# Patient Record
Sex: Female | Born: 1995
Health system: Southern US, Community
[De-identification: ages and names within clinical notes are randomized; demographics above are authoritative.]

## PROBLEM LIST (undated history)

## (undated) DIAGNOSIS — F419 Anxiety disorder, unspecified: Secondary | ICD-10-CM

## (undated) DIAGNOSIS — M419 Scoliosis, unspecified: Secondary | ICD-10-CM

## (undated) HISTORY — PX: BACK SURGERY: SHX140

## (undated) HISTORY — DX: Scoliosis, unspecified: M41.9

## (undated) HISTORY — DX: Anxiety disorder, unspecified: F41.9

---
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
## 2017-11-04 ENCOUNTER — Emergency Department
Admission: EM | Admit: 2017-11-04 | Discharge: 2017-11-04 | Disposition: A | Discharge status: Home / Self Care | Payer: 59 | Attending: Emergency Medicine | Admitting: Emergency Medicine

## 2017-11-04 ENCOUNTER — Ambulatory Visit
Admission: RE | Admit: 2017-11-04 | Discharge: 2017-11-04 | Disposition: A | Discharge status: Home / Self Care | Payer: 59 | Source: Ambulatory Visit | Attending: Nurse Practitioner | Admitting: Nurse Practitioner

## 2017-11-04 ENCOUNTER — Other Ambulatory Visit: Payer: Self-pay | Admitting: Nurse Practitioner

## 2017-11-04 ENCOUNTER — Other Ambulatory Visit: Payer: Self-pay

## 2017-11-04 ENCOUNTER — Encounter: Payer: Self-pay | Admitting: Emergency Medicine

## 2017-11-04 DIAGNOSIS — M25561 Pain in right knee: Secondary | ICD-10-CM | POA: Insufficient documentation

## 2017-11-04 DIAGNOSIS — R21 Rash and other nonspecific skin eruption: Secondary | ICD-10-CM | POA: Diagnosis not present

## 2017-11-04 DIAGNOSIS — R52 Pain, unspecified: Secondary | ICD-10-CM

## 2017-11-04 DIAGNOSIS — F430 Acute stress reaction: Secondary | ICD-10-CM | POA: Diagnosis not present

## 2017-11-04 DIAGNOSIS — F411 Generalized anxiety disorder: Secondary | ICD-10-CM | POA: Diagnosis not present

## 2017-11-04 DIAGNOSIS — Z79899 Other long term (current) drug therapy: Secondary | ICD-10-CM | POA: Insufficient documentation

## 2017-11-04 DIAGNOSIS — F419 Anxiety disorder, unspecified: Secondary | ICD-10-CM | POA: Diagnosis present

## 2017-11-04 MED ORDER — HYDROXYZINE HCL 25 MG PO TABS
25.0000 mg | ORAL_TABLET | Freq: Once | ORAL | Status: AC
  Administered 2017-11-04: 25 mg via ORAL
  Filled 2017-11-04: qty 1

## 2017-11-04 MED ORDER — HYDROXYZINE HCL 25 MG PO TABS: 25.0000 mg | ORAL_TABLET | Freq: Three times a day (TID) | ORAL | 0 refills | Status: DC | PRN

## 2017-11-04 NOTE — ED Triage Notes (Signed)
Recently found out (last week) that she is allergic to gluten.  Today had some nachos and cheese. Felt heart palpitations and felt shaky. Called PCP who referred her to ED for evaluation of anxiety vs allergic reaction.  Symptoms began today around 1615.  Symptoms have improved.  Continues to c/o chest feeling tight and lips tingling.    Patient is AAOx3,  Skin warm and dry.  NAD.  Respirations are regular and non labored.  Voice clear and strong.

## 2017-11-04 NOTE — Discharge Instructions (Signed)
You may take medication as prescribed.  I highly recommend you follow-up with your medical provider to discuss anxiety related to new diet restrictions.  If you begin to experience return of symptoms or symptoms significantly worsen do not hesitate to return the emergency department.

## 2017-11-04 NOTE — ED Provider Notes (Signed)
[facility] Emergency Department Provider Note   ____________________________________________   I have reviewed the triage vital signs and the nursing notes.   HISTORY  Chief Complaint Allergic Reaction    HPI Grace Buchanan is a 21 y.o. female presents emergency department with anxiety symptoms to include racing heart, chest tightness, tingling lips and fingers that began after she realized she had eaten a cheese dip with gluten earlier this evening.  Patient recently found out that she is not supposed to be eating gluten-based products and has been avoiding these products.  Patient reported that when she eats gluten-based products she experiences chest tightness and has been worked up for food allergies.  Patient's primary care provider recently completed blood work to determine that she is allergic to seafood and gluten-based products.  Patient denies any anaphylactic reaction to seafood or gluten-based products.  Patient reports when she began to experience the above symptoms she called her provider and they informed her not to take any medication and to immediately seek care in the emergency department. Patient denies fever, chills, headache, vision changes, chest pain, shortness of breath, abdominal pain, nausea and vomiting.  History reviewed. No pertinent past medical history.  There are no active problems to display for this patient.   Past Surgical History:  Procedure Laterality Date  . BACK SURGERY      Prior to Admission medications   Medication Sig Start Date End Date Taking? Authorizing Provider  amoxicillin (AMOXIL) 500 MG capsule Take 1 capsule (500 mg total) by mouth 3 (three) times daily. 12/24/16   Loleta RoseForbach, Cory, MD  hydrOXYzine (ATARAX/VISTARIL) 25 MG tablet Take 1 tablet (25 mg total) 3 (three) times daily as needed by mouth for anxiety or itching. 11/04/17   Little, Traci M, PA-C  ibuprofen (ADVIL,MOTRIN) 600 MG tablet Take 1 tablet by  mouth three times daily with meals 12/24/16   Loleta RoseForbach, Cory, MD    Allergies Dust mite extract; Fish allergy; Gluten meal; and Shrimp [shellfish allergy]  No family history on file.  Social History Social History   Tobacco Use  . Smoking status: Never Smoker  . Smokeless tobacco: Never Used  Substance Use Topics  . Alcohol use: No  . Drug use: Not on file    Review of Systems Constitutional: Negative for fever/chills Eyes: No visual changes. ENT:  Negative for sore throat and for difficulty swallowing Cardiovascular: Positive for chest tightness.  Denies chest pain. Respiratory: Denies cough. Denies shortness of breath or difficulty breathing. Gastrointestinal: No abdominal pain.  No nausea, vomiting, diarrhea. Musculoskeletal: Negative for back pain. Skin: Negative for rash. Neurological: Negative for headaches.  Negative focal weakness or numbness. Negative for loss of consciousness. Able to ambulate. ____________________________________________   PHYSICAL EXAM:  VITAL SIGNS: ED Triage Vitals  Enc Vitals Group     BP 11/04/17 1730 139/79     Pulse Rate 11/04/17 1730 85     Resp 11/04/17 1730 18     Temp 11/04/17 1730 98.4 F (36.9 C)     Temp Source 11/04/17 1730 Oral     SpO2 11/04/17 1730 98 %     Weight 11/04/17 1727 270 lb (122.5 kg)     Height 11/04/17 1727 5\' 7"  (1.702 m)     Head Circumference --      Peak Flow --      Pain Score 11/04/17 1727 0     Pain Loc --      Pain Edu? --  Excl. in GC? --     Constitutional: Alert and oriented. Well appearing and in no acute distress.  Eyes: Conjunctivae are normal. PERRL. EOMI  Head: Normocephalic and atraumatic. ENT:      Mouth/Throat: Mucous membranes are moist. Oropharynx not erythematous or edematous.  Tonsils symmetrical bilaterally.  Uvula midline. Neck:Supple. Cardiovascular: Normal rate, regular rhythm. Normal S1 and S2.  Good peripheral circulation. Respiratory: Normal respiratory effort without  tachypnea or retractions. Lungs CTAB. No wheezes/rales/rhonchi. Good air entry to the bases with no decreased or absent breath sounds. Musculoskeletal: Nontender with normal range of motion in all extremities. Neurologic: Normal speech and language. No gross focal neurologic deficits are appreciated.  Skin:  Skin is warm, dry and intact.  Pruritic erythematous rash along the upper chest that developed after eating gluten-based cheese product. Psychiatric: Mood and affect are normal. Speech and behavior are normal. Patient exhibits appropriate insight and judgement.  ____________________________________________   LABS (all labs ordered are listed, but only abnormal results are displayed)  Labs Reviewed - No data to display ____________________________________________  EKG 12-lead EKG unremarkable for acute STEMI Sinus rhythm ____________________________________________  RADIOLOGY none ____________________________________________   PROCEDURES  Procedure(s) performed: no    Critical Care performed: no ____________________________________________   INITIAL IMPRESSION / ASSESSMENT AND PLAN / ED COURSE  Pertinent labs & imaging results that were available during my care of the patient were reviewed by me and considered in my medical decision making (see chart for details).  Patient presents to emergency department with anxiety symptoms to include racing heart, chest tightness, tingling lips and fingers that began after she realized she had eaten a cheese dip with gluten. History and physical exam findings are consistent with acute panic attack and mild skin allergic reaction. Patient noted reduced itching, rash and anxiety following hydroxyzine given during the course of care in the emergency department. Patient will be prescribed short course of hydroxyzine for anxiety and itching as needed. Reassessment of patient reassuring at time of discharge. Patient advised to follow up with  PCP as soon as she is schedule for a follow up appointment. Also advised to return to the emergency department if symptoms return or significantly worsen. Patient informed of clinical course, understand medical decision-making process, and agree with plan.  ____________________________________________   FINAL CLINICAL IMPRESSION(S) / ED DIAGNOSES  Final diagnoses:  Rash  Anxiety in acute stress reaction       NEW MEDICATIONS STARTED DURING THIS VISIT:  This SmartLink is deprecated. Use AVSMEDLIST instead to display the medication list for a patient.   Note:  This document was prepared using Dragon voice recognition software and may include unintentional dictation errors.    Clois ComberLittle, Traci M, PA-C 11/04/17 1853    Phineas SemenGoodman, Graydon, MD 11/04/17 [phone]

## 2017-12-31 DIAGNOSIS — R0683 Snoring: Secondary | ICD-10-CM | POA: Diagnosis not present

## 2017-12-31 DIAGNOSIS — J353 Hypertrophy of tonsils with hypertrophy of adenoids: Secondary | ICD-10-CM | POA: Diagnosis not present

## 2017-12-31 HISTORY — PX: TONSILECTOMY/ADENOIDECTOMY WITH MYRINGOTOMY: SHX6125

## 2018-01-01 DIAGNOSIS — L538 Other specified erythematous conditions: Secondary | ICD-10-CM | POA: Diagnosis not present

## 2018-01-01 DIAGNOSIS — A46 Erysipelas: Secondary | ICD-10-CM | POA: Diagnosis not present

## 2018-01-01 DIAGNOSIS — R220 Localized swelling, mass and lump, head: Secondary | ICD-10-CM | POA: Diagnosis not present

## 2018-01-01 DIAGNOSIS — T8189XA Other complications of procedures, not elsewhere classified, initial encounter: Secondary | ICD-10-CM | POA: Diagnosis not present

## 2018-01-02 DIAGNOSIS — L538 Other specified erythematous conditions: Secondary | ICD-10-CM | POA: Diagnosis not present

## 2018-01-04 DIAGNOSIS — L539 Erythematous condition, unspecified: Secondary | ICD-10-CM | POA: Diagnosis not present

## 2018-01-04 DIAGNOSIS — R600 Localized edema: Secondary | ICD-10-CM | POA: Diagnosis not present

## 2018-01-04 DIAGNOSIS — R220 Localized swelling, mass and lump, head: Secondary | ICD-10-CM | POA: Diagnosis not present

## 2018-01-04 DIAGNOSIS — R200 Anesthesia of skin: Secondary | ICD-10-CM | POA: Diagnosis not present

## 2018-01-06 ENCOUNTER — Ambulatory Visit: Payer: Self-pay | Admitting: Nurse Practitioner

## 2018-01-13 DIAGNOSIS — M6281 Muscle weakness (generalized): Secondary | ICD-10-CM | POA: Diagnosis not present

## 2018-01-13 DIAGNOSIS — M25661 Stiffness of right knee, not elsewhere classified: Secondary | ICD-10-CM | POA: Diagnosis not present

## 2018-01-13 DIAGNOSIS — M25561 Pain in right knee: Secondary | ICD-10-CM | POA: Diagnosis not present

## 2018-01-19 ENCOUNTER — Encounter: Payer: Self-pay | Admitting: Nurse Practitioner

## 2018-01-19 ENCOUNTER — Other Ambulatory Visit: Payer: Self-pay

## 2018-01-19 ENCOUNTER — Ambulatory Visit: Payer: 59 | Admitting: Nurse Practitioner

## 2018-01-19 VITALS — BP 131/78 | HR 85 | Resp 16 | Ht 68.0 in | Wt 271.4 lb

## 2018-01-19 DIAGNOSIS — E668 Other obesity: Secondary | ICD-10-CM

## 2018-01-19 DIAGNOSIS — M1712 Unilateral primary osteoarthritis, left knee: Secondary | ICD-10-CM | POA: Diagnosis not present

## 2018-01-19 MED ORDER — MELOXICAM 7.5 MG PO TABS: 7.5000 mg | ORAL_TABLET | Freq: Every day | ORAL | 1 refills | Status: DC

## 2018-01-19 MED ORDER — HYDROXYZINE HCL 25 MG PO TABS: ORAL_TABLET | ORAL | 1 refills | Status: DC

## 2018-01-19 NOTE — Progress Notes (Addendum)
[facility] [address] [place], [place] [address]  Internal MEDICINE  Office Visit Note  Patient Name: Grace Buchanan  [id number]  [id number]  Date of Service: 01/19/2018  No chief complaint on file.   The patient was seen for this in the past. Prescribed phentermine daily. She has not had this filled. She Had tonsillectomy 12/31/2017 and has rough recovery. She is to have her 3 week follow up tomorrow. She is interested in diet/liefstyle modifications to help with weight loss .   Other  This is a chronic problem. The current episode started more than 1 year ago. The problem has been gradually improving. Associated symptoms include arthralgias and a sore throat. Pertinent negatives include no abdominal pain, fatigue, headaches, nausea, rash, vomiting or weakness. Treatments tried: diet and lifestyle changes. increased exercise . The treatment provided mild relief.    Pt is here for routine follow up.    Current Medication: Outpatient Encounter Medications as of 01/19/2018  Medication Sig  . amoxicillin (AMOXIL) 500 MG capsule Take 1 capsule (500 mg total) by mouth 3 (three) times daily.  . hydrOXYzine (ATARAX/VISTARIL) 25 MG tablet Take 1 tablet (25 mg total) 3 (three) times daily as needed by mouth for anxiety or itching.  Marland Kitchen ibuprofen (ADVIL,MOTRIN) 600 MG tablet Take 1 tablet by mouth three times daily with meals   No facility-administered encounter medications on file as of 01/19/2018.     Surgical History: Past Surgical History:  Procedure Laterality Date  . BACK SURGERY      Medical History: Past Medical History:  Diagnosis Date  . Scoliosis     Family History: Family History  Problem Relation Age of Onset  . Hyperthyroidism Mother   . Hypertension Mother   . Hypertension Father     Social History   Socioeconomic History  . Marital status: Single    Spouse name: Not on file  . Number of children: Not on file  . Years of education: Not on file    . Highest education level: Not on file  Social Needs  . Financial resource strain: Not on file  . Food insecurity - worry: Not on file  . Food insecurity - inability: Not on file  . Transportation needs - medical: Not on file  . Transportation needs - non-medical: Not on file  Occupational History  . Not on file  Tobacco Use  . Smoking status: Never Smoker  . Smokeless tobacco: Never Used  Substance and Sexual Activity  . Alcohol use: Yes    Comment: ocassionally  . Drug use: No  . Sexual activity: No  Other Topics Concern  . Not on file  Social History Narrative  . Not on file      Review of Systems  Constitutional: Positive for unexpected weight change. Negative for activity change, appetite change and fatigue.       4 pound weight loss   HENT: Positive for sore throat, trouble swallowing and voice change.   Eyes: Negative.   Respiratory: Negative for chest tightness, shortness of breath and wheezing.   Gastrointestinal: Negative for abdominal distention, abdominal pain, diarrhea, nausea and vomiting.  Endocrine: Negative for cold intolerance, heat intolerance, polydipsia, polyphagia and polyuria.  Musculoskeletal: Positive for arthralgias.       Persistent knee pain. Continues to do physical therapy as scheduled.   Skin: Negative for color change, pallor, rash and wound.  Allergic/Immunologic: Positive for environmental allergies and food allergies.  Neurological: Negative for weakness and headaches.  Hematological: Negative for adenopathy. Does not bruise/bleed easily.  Psychiatric/Behavioral: Negative for behavioral problems. The patient is not nervous/anxious.     Today's Vitals   01/19/18 1519  BP: 131/78  Pulse: 85  Resp: 16  SpO2: 98%  Weight: 271 lb 6.4 oz (123.1 kg)  Height: 5\' 8"  (1.727 m)    Physical Exam  Constitutional: She is oriented to person, place, and time. She appears well-developed and well-nourished. No distress.  HENT:  Head:  Normocephalic and atraumatic.  Mouth/Throat: Oropharynx is clear and moist. No oropharyngeal exudate.  Redness and inflammation in post oropharyngeal region. Tonsils now surgically absent.   Eyes: EOM are normal. Pupils are equal, round, and reactive to light.  Neck: Normal range of motion. Neck supple. No JVD present. No tracheal deviation present. No thyromegaly present.  Cardiovascular: Normal rate, regular rhythm and normal heart sounds. Exam reveals no gallop and no friction rub.  No murmur heard. Pulmonary/Chest: Effort normal. No respiratory distress. She has no wheezes. She has no rales. She exhibits no tenderness.  Abdominal: Soft. Bowel sounds are normal.  Musculoskeletal:       Legs: Lymphadenopathy:    She has no cervical adenopathy.  Neurological: She is alert and oriented to person, place, and time. No cranial nerve deficit.  Skin: Skin is warm and dry. She is not diaphoretic.  Psychiatric: She has a normal mood and affect. Her behavior is normal. Judgment and thought content normal.  Nursing note and vitals reviewed.   Assessment/Plan: 1. Moderate obesity Nova diet plans given to patient. Also given 1500 and 1800 calorie diet plans. Instructed her to gradually increae routine exercise.   2. Primary osteoarthritis of left knee Continue physical therapy as recommended.   General Counseling: [name] verbalizes understanding of the findings of todays visit and agrees with plan of treatment. I have discussed any further diagnostic evaluation that may be needed or ordered today. We also reviewed her medications today. she has been encouraged to call the office with any questions or concerns that should arise related to todays visit.  This patient was seen by Vincent GrosHeather Curry Seefeldt, FNP- C in Collaboration with Dr Lyndon CodeFozia M Khan as a part of collaborative care agreement     Time spent: 15 Minutes    Dr Lyndon CodeFozia M Khan Internal medicine

## 2018-01-20 DIAGNOSIS — M25661 Stiffness of right knee, not elsewhere classified: Secondary | ICD-10-CM | POA: Diagnosis not present

## 2018-01-20 DIAGNOSIS — M6281 Muscle weakness (generalized): Secondary | ICD-10-CM | POA: Diagnosis not present

## 2018-01-20 DIAGNOSIS — M25561 Pain in right knee: Secondary | ICD-10-CM | POA: Diagnosis not present

## 2018-01-27 DIAGNOSIS — M25661 Stiffness of right knee, not elsewhere classified: Secondary | ICD-10-CM | POA: Diagnosis not present

## 2018-01-27 DIAGNOSIS — M6281 Muscle weakness (generalized): Secondary | ICD-10-CM | POA: Diagnosis not present

## 2018-01-27 DIAGNOSIS — M25561 Pain in right knee: Secondary | ICD-10-CM | POA: Diagnosis not present

## 2018-01-28 DIAGNOSIS — E668 Other obesity: Secondary | ICD-10-CM | POA: Insufficient documentation

## 2018-01-28 DIAGNOSIS — M1712 Unilateral primary osteoarthritis, left knee: Secondary | ICD-10-CM | POA: Insufficient documentation

## 2018-01-29 DIAGNOSIS — M6281 Muscle weakness (generalized): Secondary | ICD-10-CM | POA: Diagnosis not present

## 2018-01-29 DIAGNOSIS — M25561 Pain in right knee: Secondary | ICD-10-CM | POA: Diagnosis not present

## 2018-01-29 DIAGNOSIS — M25661 Stiffness of right knee, not elsewhere classified: Secondary | ICD-10-CM | POA: Diagnosis not present

## 2018-02-03 DIAGNOSIS — R262 Difficulty in walking, not elsewhere classified: Secondary | ICD-10-CM | POA: Diagnosis not present

## 2018-02-03 DIAGNOSIS — M6281 Muscle weakness (generalized): Secondary | ICD-10-CM | POA: Diagnosis not present

## 2018-02-03 DIAGNOSIS — M25561 Pain in right knee: Secondary | ICD-10-CM | POA: Diagnosis not present

## 2018-02-09 ENCOUNTER — Telehealth: Payer: Self-pay | Admitting: Internal Medicine

## 2018-02-09 NOTE — Telephone Encounter (Signed)
SIGNED PT ORDER FAXED TO [facility] AND PUT INTO [email or web address]

## 2018-02-10 DIAGNOSIS — M6281 Muscle weakness (generalized): Secondary | ICD-10-CM | POA: Diagnosis not present

## 2018-02-10 DIAGNOSIS — R262 Difficulty in walking, not elsewhere classified: Secondary | ICD-10-CM | POA: Diagnosis not present

## 2018-02-10 DIAGNOSIS — M25561 Pain in right knee: Secondary | ICD-10-CM | POA: Diagnosis not present

## 2018-02-12 DIAGNOSIS — R262 Difficulty in walking, not elsewhere classified: Secondary | ICD-10-CM | POA: Diagnosis not present

## 2018-02-12 DIAGNOSIS — M25561 Pain in right knee: Secondary | ICD-10-CM | POA: Diagnosis not present

## 2018-02-12 DIAGNOSIS — M6281 Muscle weakness (generalized): Secondary | ICD-10-CM | POA: Diagnosis not present

## 2018-03-11 ENCOUNTER — Encounter: Payer: Self-pay | Admitting: Emergency Medicine

## 2018-03-11 ENCOUNTER — Other Ambulatory Visit: Payer: Self-pay

## 2018-03-11 ENCOUNTER — Emergency Department
Admission: EM | Admit: 2018-03-11 | Discharge: 2018-03-11 | Disposition: A | Discharge status: Home / Self Care | Payer: 59 | Attending: Emergency Medicine | Admitting: Emergency Medicine

## 2018-03-11 DIAGNOSIS — J111 Influenza due to unidentified influenza virus with other respiratory manifestations: Secondary | ICD-10-CM | POA: Diagnosis not present

## 2018-03-11 DIAGNOSIS — R05 Cough: Secondary | ICD-10-CM | POA: Diagnosis not present

## 2018-03-11 DIAGNOSIS — Z79899 Other long term (current) drug therapy: Secondary | ICD-10-CM | POA: Insufficient documentation

## 2018-03-11 DIAGNOSIS — R69 Illness, unspecified: Secondary | ICD-10-CM

## 2018-03-11 LAB — INFLUENZA PANEL BY PCR (TYPE A & B)
INFLAPCR: NEGATIVE
Influenza B By PCR: NEGATIVE

## 2018-03-11 LAB — GROUP A STREP BY PCR: Group A Strep by PCR: NOT DETECTED

## 2018-03-11 MED ORDER — OSELTAMIVIR PHOSPHATE 75 MG PO CAPS: 75.0000 mg | ORAL_CAPSULE | Freq: Two times a day (BID) | ORAL | 0 refills | Status: DC

## 2018-03-11 NOTE — ED Provider Notes (Signed)
[facility] Emergency Department Provider Note  ____________________________________________   First MD Initiated Contact with Patient 03/11/18 2026     (approximate)  I have reviewed the triage vital signs and the nursing notes.   HISTORY  Chief Complaint Fever    HPI Grace Buchanan is a 22 y.o. female presents emergency department complaining of cough and fever.  She states her temperature was 101 at home.  Her sibling has the same symptoms.  She denies vomiting or diarrhea.  She is not taking any medications prior to arrival.  Past Medical History:  Diagnosis Date  . Scoliosis     Patient Active Problem List   Diagnosis Date Noted  . Moderate obesity 01/28/2018  . Primary osteoarthritis of left knee 01/28/2018    Past Surgical History:  Procedure Laterality Date  . BACK SURGERY    . TONSILECTOMY/ADENOIDECTOMY WITH MYRINGOTOMY Bilateral 12/31/2017    Prior to Admission medications   Medication Sig Start Date End Date Taking? Authorizing Provider  amoxicillin (AMOXIL) 500 MG capsule Take 1 capsule (500 mg total) by mouth 3 (three) times daily. 12/24/16   Loleta RoseForbach, Cory, MD  hydrOXYzine (ATARAX/VISTARIL) 25 MG tablet Take 1 tablet by mouth daily as needed for allergic reaction/itching 01/19/18   Carlean JewsBoscia, Heather E, NP  ibuprofen (ADVIL,MOTRIN) 600 MG tablet Take 1 tablet by mouth three times daily with meals 12/24/16   Loleta RoseForbach, Cory, MD  meloxicam (MOBIC) 7.5 MG tablet Take 1 tablet (7.5 mg total) by mouth daily. 01/19/18   Carlean JewsBoscia, Heather E, NP  oseltamivir (TAMIFLU) 75 MG capsule Take 1 capsule (75 mg total) by mouth 2 (two) times daily. 03/11/18   Faythe GheeFisher, Shone Leventhal W, PA-C    Allergies Anesthesia [id number]-[id number]; Dust mite extract; Fish allergy; Gluten meal; and Shrimp [shellfish allergy]  Family History  Problem Relation Age of Onset  . Hyperthyroidism Mother   . Hypertension Mother   . Hypertension Father     Social History Social History   Tobacco  Use  . Smoking status: Never Smoker  . Smokeless tobacco: Never Used  Substance Use Topics  . Alcohol use: Yes    Comment: ocassionally  . Drug use: No    Review of Systems  Constitutional: Positive fever/chills Eyes: No visual changes. ENT: Positive sore throat. Respiratory: Positive cough Genitourinary: Negative for dysuria. Musculoskeletal: Negative for back pain. Skin: Negative for rash.    ____________________________________________   PHYSICAL EXAM:  VITAL SIGNS: ED Triage Vitals  Enc Vitals Group     BP 03/11/18 2010 (!) 157/79     Pulse Rate 03/11/18 2010 (!) 124     Resp 03/11/18 2010 20     Temp 03/11/18 2010 99.1 F (37.3 C)     Temp Source 03/11/18 2010 Oral     SpO2 03/11/18 2010 100 %     Weight 03/11/18 2011 260 lb (117.9 kg)     Height 03/11/18 2011 5\' 8"  (1.727 m)     Head Circumference --      Peak Flow --      Pain Score 03/11/18 2011 6     Pain Loc --      Pain Edu? --      Excl. in GC? --     Constitutional: Alert and oriented. Well appearing and in no acute distress. Eyes: Conjunctivae are normal.  Head: Atraumatic. Ears: TMs are clear bilaterally Nose: No congestion/rhinnorhea. Mouth/Throat: Mucous membranes are moist.  Throat is red Neck: Is supple, no lymphadenopathy is noted Cardiovascular:  Normal rate, regular rhythm.  Heart sounds are normal Respiratory: Normal respiratory effort.  No retractions, lungs clear to auscultation GU: deferred Musculoskeletal: FROM all extremities, warm and well perfused Neurologic:  Normal speech and language.  Skin:  Skin is warm, dry and intact. No rash noted. Psychiatric: Mood and affect are normal. Speech and behavior are normal.  ____________________________________________   LABS (all labs ordered are listed, but only abnormal results are displayed)  Labs Reviewed  GROUP A STREP BY PCR  INFLUENZA PANEL BY PCR (TYPE A & B)    ____________________________________________   ____________________________________________  RADIOLOGY    ____________________________________________   PROCEDURES  Procedure(s) performed: No  Procedures    ____________________________________________   INITIAL IMPRESSION / ASSESSMENT AND PLAN / ED COURSE  Pertinent labs & imaging results that were available during my care of the patient were reviewed by me and considered in my medical decision making (see chart for details).  Patient is 22 year old female presents emergency department with her sister who has same symptoms.  She complains of flulike symptoms.  She denies any vomiting or diarrhea at this time  Physical exam she appears well and nontoxic.  Remainder the exam is basically benign other than throat looks a little irritated  Flu test and strep test are ordered  Flu test is negative, strep test is negative  However her sisters flu test is positive.  Patient will be treated for influenza due to the sister's test being positive.  They are living in the same home and have the same symptoms.  The test results were explained to patient.  She was given a prescription for Tamiflu.  She is to follow-up with her regular doctor if not better in 3-5 days.  Return to emergency department if worsening.  Patient states she understands was discharged in stable condition    As part of my medical decision making, I reviewed the following data within the electronic MEDICAL RECORD NUMBER Nursing notes reviewed and incorporated, Labs reviewed flu and strep test are negative, Notes from prior ED visits and [place] Controlled Substance Database  ____________________________________________   FINAL CLINICAL IMPRESSION(S) / ED DIAGNOSES  Final diagnoses:  Influenza-like illness      NEW MEDICATIONS STARTED DURING THIS VISIT:  New Prescriptions   OSELTAMIVIR (TAMIFLU) 75 MG CAPSULE    Take 1 capsule (75 mg total) by mouth 2 (two)  times daily.     Note:  This document was prepared using Dragon voice recognition software and may include unintentional dictation errors.    Faythe Ghee, PA-C 03/11/18 2129    Phineas Semen, MD 03/11/18 2215

## 2018-03-11 NOTE — Discharge Instructions (Signed)
Follow-up with your regular doctor if you are not better in 3-5 days.  Take Tylenol and ibuprofen for fever.  Can take Tamiflu.  This does not cure the flu but may help with your symptoms.  Return to the emergency department if you are worsening

## 2018-03-11 NOTE — ED Triage Notes (Signed)
Patient to ER for c/o cough and fever. Patient reports fever of 101 at home, here with sibling that also has similar symptoms.

## 2018-05-21 ENCOUNTER — Ambulatory Visit: Payer: Self-pay | Admitting: Nurse Practitioner

## 2018-05-25 ENCOUNTER — Other Ambulatory Visit: Payer: Self-pay | Admitting: Nurse Practitioner

## 2018-05-25 MED ORDER — VITAMIN D (ERGOCALCIFEROL) 1.25 MG (50000 UNIT) PO CAPS: 50000.0000 [IU] | ORAL_CAPSULE | ORAL | 0 refills | Status: DC

## 2018-05-27 ENCOUNTER — Other Ambulatory Visit: Payer: Self-pay | Admitting: Nurse Practitioner

## 2018-05-27 MED ORDER — VITAMIN D (ERGOCALCIFEROL) 1.25 MG (50000 UNIT) PO CAPS: 50000.0000 [IU] | ORAL_CAPSULE | ORAL | 1 refills | Status: DC

## 2018-07-07 ENCOUNTER — Other Ambulatory Visit: Payer: Self-pay | Admitting: Nurse Practitioner

## 2018-07-07 MED ORDER — VITAMIN D (ERGOCALCIFEROL) 1.25 MG (50000 UNIT) PO CAPS: 50000.0000 [IU] | ORAL_CAPSULE | ORAL | 1 refills | Status: DC

## 2018-08-03 ENCOUNTER — Ambulatory Visit: Payer: Self-pay | Admitting: Nurse Practitioner

## 2018-08-05 ENCOUNTER — Ambulatory Visit: Payer: Self-pay

## 2019-01-28 ENCOUNTER — Ambulatory Visit: Payer: 59 | Admitting: Nurse Practitioner

## 2019-01-28 ENCOUNTER — Encounter (INDEPENDENT_AMBULATORY_CARE_PROVIDER_SITE_OTHER): Payer: Self-pay

## 2019-01-28 ENCOUNTER — Encounter: Payer: Self-pay | Admitting: Nurse Practitioner

## 2019-01-28 ENCOUNTER — Other Ambulatory Visit: Payer: Self-pay | Admitting: Nurse Practitioner

## 2019-01-28 VITALS — BP 125/77 | HR 83 | Resp 16 | Ht 67.0 in | Wt 283.0 lb

## 2019-01-28 DIAGNOSIS — R480 Dyslexia and alexia: Secondary | ICD-10-CM

## 2019-01-28 DIAGNOSIS — M1711 Unilateral primary osteoarthritis, right knee: Secondary | ICD-10-CM | POA: Diagnosis not present

## 2019-01-28 DIAGNOSIS — G8929 Other chronic pain: Secondary | ICD-10-CM

## 2019-01-28 DIAGNOSIS — M25561 Pain in right knee: Principal | ICD-10-CM

## 2019-01-28 DIAGNOSIS — F418 Other specified anxiety disorders: Secondary | ICD-10-CM

## 2019-01-28 DIAGNOSIS — L500 Allergic urticaria: Secondary | ICD-10-CM

## 2019-01-28 MED ORDER — IBUPROFEN 600 MG PO TABS: ORAL_TABLET | ORAL | 0 refills | Status: DC

## 2019-01-28 MED ORDER — HYDROXYZINE HCL 25 MG PO TABS: ORAL_TABLET | ORAL | 1 refills | Status: DC

## 2019-01-28 NOTE — Progress Notes (Signed)
[facility] [address] [place], [place] [address]  Internal MEDICINE  Office Visit Note  Patient Name: Grace Buchanan  [id number]  [id number]  Date of Service: 01/29/2019  Chief Complaint  Patient presents with  . Pain    pt is here for right knee pain for a while, has tried Pt and it didnt help at all, and dyslexia    n is a 23 year old female with Scoliosis  who presents to the office for a follow up visit. Today she is complaining of pain in the right knee. did have x-ray which was without abnormality. She did approximately six weeks of physical therapy. This did help some, but she has continued right knee pain with swelling. Causes her to have difficulty walking for more that five minutes. Next step is to have MRI of the right knee for further evaluation and treatment. She states that exertion causes the pain to be worse. She states that taking medication, such as NSAIDs don't help so much. Using heat and ice help the pain more than anything.  The patient has history of dyslexia and as a result, test anxiety. She has required extra time to tke exams and quizzes in school and now, in college. She is attending classes at [employer]. They are requiring documentation that this is a documented  Issue for her and that she, indeed needs the extra time for tests and quizzes.       Current Medication: Outpatient Encounter Medications as of 01/28/2019  Medication Sig  . hydrOXYzine (ATARAX/VISTARIL) 25 MG tablet Take 1 tablet by mouth daily as needed for allergic reaction/itching  . ibuprofen (ADVIL,MOTRIN) 600 MG tablet Take 1 tablet by mouth three times daily with meals  . oseltamivir (TAMIFLU) 75 MG capsule Take 1 capsule (75 mg total) by mouth 2 (two) times daily.  . Vitamin D, Ergocalciferol, (DRISDOL) 50000 units CAPS capsule Take 1 capsule (50,000 Units total) by mouth every 7 (seven) days.  . [DISCONTINUED] hydrOXYzine (ATARAX/VISTARIL) 25 MG tablet Take 1  tablet by mouth daily as needed for allergic reaction/itching  . [DISCONTINUED] ibuprofen (ADVIL,MOTRIN) 600 MG tablet Take 1 tablet by mouth three times daily with meals  . [DISCONTINUED] meloxicam (MOBIC) 7.5 MG tablet Take 1 tablet (7.5 mg total) by mouth daily.  . [DISCONTINUED] amoxicillin (AMOXIL) 500 MG capsule Take 1 capsule (500 mg total) by mouth 3 (three) times daily. (Patient not taking: Reported on 01/28/2019)   No facility-administered encounter medications on file as of 01/28/2019.     Surgical History: Past Surgical History:  Procedure Laterality Date  . BACK SURGERY    . TONSILECTOMY/ADENOIDECTOMY WITH MYRINGOTOMY Bilateral 12/31/2017    Medical History: Past Medical History:  Diagnosis Date  . Scoliosis     Family History: Family History  Problem Relation Age of Onset  . Hyperthyroidism Mother   . Hypertension Mother   . Hypertension Father     Social History   Socioeconomic History  . Marital status: Single    Spouse name: Not on file  . Number of children: Not on file  . Years of education: Not on file  . Highest education level: Not on file  Occupational History  . Not on file  Social Needs  . Financial resource strain: Not on file  . Food insecurity:    Worry: Not on file    Inability: Not on file  . Transportation needs:    Medical: Not on file    Non-medical: Not on  file  Tobacco Use  . Smoking status: Never Smoker  . Smokeless tobacco: Never Used  Substance and Sexual Activity  . Alcohol use: Yes    Comment: ocassionally  . Drug use: No  . Sexual activity: Never  Lifestyle  . Physical activity:    Days per week: Not on file    Minutes per session: Not on file  . Stress: Not on file  Relationships  . Social connections:    Talks on phone: Not on file    Gets together: Not on file    Attends religious service: Not on file    Active member of club or organization: Not on file    Attends meetings of clubs or organizations: Not on file     Relationship status: Not on file  . Intimate partner violence:    Fear of current or ex partner: Not on file    Emotionally abused: Not on file    Physically abused: Not on file    Forced sexual activity: Not on file  Other Topics Concern  . Not on file  Social History Narrative  . Not on file      Review of Systems  Constitutional: Negative for activity change, appetite change, fatigue and unexpected weight change.  HENT: Positive for sore throat, trouble swallowing and voice change.   Eyes: Negative.   Respiratory: Negative for chest tightness, shortness of breath and wheezing.   Gastrointestinal: Negative for abdominal distention, abdominal pain, diarrhea, nausea and vomiting.  Endocrine: Negative for cold intolerance, heat intolerance, polydipsia and polyuria.  Musculoskeletal: Positive for arthralgias.       Persistent right knee pain. Completed 6 weeks physical therapy and continues to have pain, worse with walking and any exertion.   Skin: Negative for color change, pallor, rash and wound.  Allergic/Immunologic: Negative for environmental allergies and food allergies.  Neurological: Negative for weakness and headaches.       History of dyslexia  Hematological: Negative for adenopathy. Does not bruise/bleed easily.  Psychiatric/Behavioral: Negative for behavioral problems. The patient is not nervous/anxious.     Today's Vitals   01/28/19 0933  BP: 125/77  Pulse: 83  Resp: 16  SpO2: 95%  Weight: 283 lb (128.4 kg)  Height: 5\' 7"  (1.702 m)   Body mass index is 44.32 kg/m.  Physical Exam Vitals signs and nursing note reviewed.  Constitutional:      General: She is not in acute distress.    Appearance: She is well-developed. She is obese. She is not diaphoretic.  HENT:     Head: Normocephalic and atraumatic.     Mouth/Throat:     Pharynx: No oropharyngeal exudate.  Eyes:     Conjunctiva/sclera: Conjunctivae normal.     Pupils: Pupils are equal, round, and  reactive to light.  Neck:     Musculoskeletal: Normal range of motion and neck supple.     Thyroid: No thyromegaly.     Vascular: No JVD.     Trachea: No tracheal deviation.  Cardiovascular:     Rate and Rhythm: Normal rate and regular rhythm.     Heart sounds: Normal heart sounds. No murmur. No friction rub. No gallop.   Pulmonary:     Effort: Pulmonary effort is normal. No respiratory distress.     Breath sounds: Normal breath sounds. No wheezing or rales.  Chest:     Chest wall: No tenderness.  Abdominal:     General: Bowel sounds are normal.     Palpations:  Abdomen is soft.  Musculoskeletal: Normal range of motion.     Comments: Right knee tenderness. Mild/moderate swelling present. Crepitus present with flexion and extension of the knee. Increased pain whe standing from seated position. No bony abnormalities or deformities noted at this time.   Lymphadenopathy:     Cervical: No cervical adenopathy.  Skin:    General: Skin is warm and dry.  Neurological:     General: No focal deficit present.     Mental Status: She is alert and oriented to person, place, and time.     Cranial Nerves: No cranial nerve deficit.  Psychiatric:        Behavior: Behavior normal.        Thought Content: Thought content normal.        Judgment: Judgment normal.    Assessment/Plan: 1. Primary osteoarthritis of right knee Ibuprofen 600mg  may be taken up to three times daily to reduce pain and inflammation. Advised her to rest, ice, and elevate the knee when possible. Negative x-rays in the past. Completed physical therapy recommendations and no improvement. Will get MRI for further evaluation.  - ibuprofen (ADVIL,MOTRIN) 600 MG tablet; Take 1 tablet by mouth three times daily with meals  Dispense: 15 tablet; Refill: 0 - MR Knee Right Wo Contrast; Future  2. Allergic urticaria May use hydroxyzine as needed and as prescribed.  - hydrOXYzine (ATARAX/VISTARIL) 25 MG tablet; Take 1 tablet by mouth daily  as needed for allergic reaction/itching  Dispense: 90 tablet; Refill: 1  3. Dyslexia Note written and given back to the patient stating that she does have dyslexia and test anxiety. She requires extra time while taking exams and tests due to this condition.   4. Test anxiety Note written and given back to the patient stating that she does have dyslexia and test anxiety. She requires extra time while taking exams and tests due to this condition.    General Counseling: Aradia verbalizes understanding of the findings of todays visit and agrees with plan of treatment. I have discussed any further diagnostic evaluation that may be needed or ordered today. We also reviewed her medications today. she has been encouraged to call the office with any questions or concerns that should arise related to todays visit.  Apply a compressive ACE bandage. Rest and elevate the affected painful area.  Apply cold compresses intermittently as needed.  As pain recedes, begin normal activities slowly as tolerated.  Call if symptoms persist.  This patient was seen by Vincent GrosHeather Emogene Muratalla FNP Collaboration with Dr Lyndon CodeFozia M Khan as a part of collaborative care agreement  Orders Placed This Encounter  Procedures  . MR Knee Right Wo Contrast    Meds ordered this encounter  Medications  . ibuprofen (ADVIL,MOTRIN) 600 MG tablet    Sig: Take 1 tablet by mouth three times daily with meals    Dispense:  15 tablet    Refill:  0    Order Specific Question:   Supervising Provider    Answer:   Lyndon CodeKHAN, FOZIA M [[id number]]  . hydrOXYzine (ATARAX/VISTARIL) 25 MG tablet    Sig: Take 1 tablet by mouth daily as needed for allergic reaction/itching    Dispense:  90 tablet    Refill:  1    Order Specific Question:   Supervising Provider    Answer:   Lyndon CodeKHAN, FOZIA M [[id number]]    Time spent: 3825 Minutes      Dr Lyndon CodeFozia M Khan Internal medicine

## 2019-01-29 DIAGNOSIS — R480 Dyslexia and alexia: Secondary | ICD-10-CM | POA: Insufficient documentation

## 2019-01-29 DIAGNOSIS — M1711 Unilateral primary osteoarthritis, right knee: Secondary | ICD-10-CM | POA: Insufficient documentation

## 2019-01-29 DIAGNOSIS — F418 Other specified anxiety disorders: Secondary | ICD-10-CM | POA: Insufficient documentation

## 2019-01-29 DIAGNOSIS — L500 Allergic urticaria: Secondary | ICD-10-CM | POA: Insufficient documentation

## 2019-04-13 ENCOUNTER — Other Ambulatory Visit: Payer: Self-pay

## 2019-04-13 ENCOUNTER — Encounter: Payer: Self-pay | Admitting: Obstetrics and Gynecology

## 2019-04-13 ENCOUNTER — Ambulatory Visit (INDEPENDENT_AMBULATORY_CARE_PROVIDER_SITE_OTHER): Payer: 59 | Admitting: Obstetrics and Gynecology

## 2019-04-13 VITALS — BP 118/62 | Ht 67.0 in | Wt 284.0 lb

## 2019-04-13 DIAGNOSIS — Z7189 Other specified counseling: Secondary | ICD-10-CM

## 2019-04-13 DIAGNOSIS — Z01419 Encounter for gynecological examination (general) (routine) without abnormal findings: Secondary | ICD-10-CM

## 2019-04-13 DIAGNOSIS — Z6841 Body Mass Index (BMI) 40.0 and over, adult: Secondary | ICD-10-CM

## 2019-04-13 DIAGNOSIS — Z23 Encounter for immunization: Secondary | ICD-10-CM

## 2019-04-13 DIAGNOSIS — N939 Abnormal uterine and vaginal bleeding, unspecified: Secondary | ICD-10-CM

## 2019-04-13 DIAGNOSIS — Z7185 Encounter for immunization safety counseling: Secondary | ICD-10-CM

## 2019-04-13 MED ORDER — NORETHIN ACE-ETH ESTRAD-FE 1-20 MG-MCG(24) PO CAPS: 1.0000 | ORAL_CAPSULE | Freq: Every day | ORAL | 0 refills | Status: DC

## 2019-04-13 NOTE — Progress Notes (Signed)
Gynecology Annual Exam  PCP: Carlean Jews, NP  Chief Complaint:  Chief Complaint  Patient presents with  . Menstrual Problem    Discuss birthcontrol  . Gardasil injection    History of Present Illness: Patient is a 23 y.o. G0 presents for annual exam. The patient has no complaints today.   LMP: Patient's last menstrual period was 04/01/2019 (exact date). Average Interval: irregular, only 1-2 times a year Duration of flow: 6 days Heavy Menses: no Clots: no Intermenstrual Bleeding: no Postcoital Bleeding: not applicable Dysmenorrhea: no  The patient is not sexually active. She currently uses none for contraception. She denies dyspareunia.  There is no notable family history of breast or ovarian cancer in her family.  The patient wears seatbelts: yes.  The patient has regular exercise: yes.    The patient denies current symptoms of depression.    Review of Systems: Review of Systems  Constitutional: Negative for chills and fever.  HENT: Negative for congestion.   Respiratory: Negative for cough and shortness of breath.   Cardiovascular: Negative for chest pain and palpitations.  Gastrointestinal: Negative for abdominal pain, constipation, diarrhea, heartburn, nausea and vomiting.  Genitourinary: Negative for dysuria, frequency and urgency.  Skin: Negative for itching and rash.  Neurological: Negative for dizziness and headaches.  Endo/Heme/Allergies: Negative for polydipsia.  Psychiatric/Behavioral: Negative for depression.    Past Medical History:  Past Medical History:  Diagnosis Date  . Scoliosis     Past Surgical History:  Past Surgical History:  Procedure Laterality Date  . BACK SURGERY    . TONSILECTOMY/ADENOIDECTOMY WITH MYRINGOTOMY Bilateral 12/31/2017    Gynecologic History:  Patient's last menstrual period was 04/01/2019 (exact date). Contraception: none Last Pap: Results were: none  Obstetric History: No obstetric history on file.   Family History:  Family History  Problem Relation Age of Onset  . Hyperthyroidism Mother   . Hypertension Mother   . Hypertension Father     Social History:  Social History   Socioeconomic History  . Marital status: Single    Spouse name: Not on file  . Number of children: Not on file  . Years of education: Not on file  . Highest education level: Not on file  Occupational History  . Not on file  Social Needs  . Financial resource strain: Not on file  . Food insecurity:    Worry: Not on file    Inability: Not on file  . Transportation needs:    Medical: Not on file    Non-medical: Not on file  Tobacco Use  . Smoking status: Never Smoker  . Smokeless tobacco: Never Used  Substance and Sexual Activity  . Alcohol use: Yes    Comment: ocassionally  . Drug use: No  . Sexual activity: Never  Lifestyle  . Physical activity:    Days per week: Not on file    Minutes per session: Not on file  . Stress: Not on file  Relationships  . Social connections:    Talks on phone: Not on file    Gets together: Not on file    Attends religious service: Not on file    Active member of club or organization: Not on file    Attends meetings of clubs or organizations: Not on file    Relationship status: Not on file  . Intimate partner violence:    Fear of current or ex partner: Not on file    Emotionally abused: Not on file  Physically abused: Not on file    Forced sexual activity: Not on file  Other Topics Concern  . Not on file  Social History Narrative  . Not on file    Allergies:  Allergies  Allergen Reactions  . Gluten Meal Other (See Comments) and Rash  . Shellfish Allergy Other (See Comments)  . Anesthesia S-I-60     Difficulty waking up from.   . Dust Mite Extract   . Fish Allergy     Medications: Prior to Admission medications   Medication Sig Start Date End Date Taking? Authorizing Provider  hydrOXYzine (ATARAX/VISTARIL) 25 MG tablet Take 1 tablet by mouth  daily as needed for allergic reaction/itching 01/28/19  Yes Carlean Jews, NP  ibuprofen (ADVIL,MOTRIN) 600 MG tablet Take 1 tablet by mouth three times daily with meals 01/28/19  Yes Boscia, Heather E, NP  Vitamin D, Ergocalciferol, (DRISDOL) 50000 units CAPS capsule Take 1 capsule (50,000 Units total) by mouth every 7 (seven) days. 07/07/18  Yes Carlean Jews, NP    Physical Exam Vitals: Blood pressure 118/62, height 5\' 7"  (1.702 m), weight 284 lb (128.8 kg), last menstrual period 04/01/2019. Body mass index is 44.48 kg/m.   General: NAD HEENT: normocephalic, anicteric Thyroid: no enlargement, no palpable nodules Pulmonary: No increased work of breathing, CTAB Cardiovascular: RRR, distal pulses 2+ Breast: Breast symmetrical, no tenderness, no palpable nodules or masses, no skin or nipple retraction present, no nipple discharge.  No axillary or supraclavicular lymphadenopathy. Abdomen: NABS, soft, non-tender, non-distended.  Umbilicus without lesions.  No hepatomegaly, splenomegaly or masses palpable. No evidence of hernia  Extremities: no edema, erythema, or tenderness Neurologic: Grossly intact Psychiatric: mood appropriate, affect full  Female chaperone present for pelvic and breast  portions of the physical exam    Assessment: 23 y.o. No obstetric history on file. routine annual exam  Plan: Problem List Items Addressed This Visit    None    Visit Diagnoses    Encounter for gynecological examination without abnormal finding    -  Primary   Abnormal uterine bleeding       Relevant Orders   TSH+Prl+FSH+TestT+LH+DHEA S...   Hemoglobin A1c   HPV vaccine counseling       Need for HPV vaccine       Relevant Orders   HPV 9-valent vaccine,Recombinat (Completed)   Class 3 severe obesity without serious comorbidity with body mass index (BMI) of 40.0 to 44.9 in adult, unspecified obesity type (HCC)       Relevant Orders   Hemoglobin A1c      1) 4) Gardasil Series discussed  and if applicable offered to patient - Patient has not previously completed 3 shot series  - gardasil today  2) STI screening  wasoffered and declined - never sexually active  3)  ASCCP guidelines and rational discussed.  Patient declines pap smear screening  4) Contraception - the patient is currently using  none.  She is interested in changing to [place]   Discussed management options for abnormal uterine bleeding including expectant, NSAIDs, tranexamic acid (Lysteda), oral progesterone (Provera, norethindrone, megace), Depo Provera, Levonorgestrel containing IUD, endometrial ablation (Novasure) or hysterectomy as definitive surgical management.  Discussed risks and benefits of each method.   Final management decision will hinge on results of patient's work up and whether an underlying etiology for the patients bleeding symptoms can be discerned.  We will conduct a basic work up examining using the PALM-COIEN classification system.  - suspect PCOS spectrum  5) Return in about 1 year (around 04/12/2020) for sometime later this week lab visit, Gardasil in 2 and 6 months, 1 year annual.   Vena AustriaAndreas Pietrina Jagodzinski, MD, Merlinda FrederickFACOG [facility], [facility] 04/13/2019, 4:44 PM

## 2019-04-16 ENCOUNTER — Other Ambulatory Visit: Payer: Self-pay

## 2019-04-16 ENCOUNTER — Other Ambulatory Visit: Payer: 59

## 2019-04-16 DIAGNOSIS — N939 Abnormal uterine and vaginal bleeding, unspecified: Secondary | ICD-10-CM

## 2019-04-16 DIAGNOSIS — Z6841 Body Mass Index (BMI) 40.0 and over, adult: Secondary | ICD-10-CM

## 2019-04-20 LAB — TSH+PRL+FSH+TESTT+LH+DHEA S...
17-Hydroxyprogesterone: 27 ng/dL
Androstenedione: 117 ng/dL (ref 41–262)
DHEA-SO4: 303.6 ug/dL (ref 110.0–431.7)
FSH: 6.6 m[IU]/mL
LH: 6.3 m[IU]/mL
Prolactin: 17.9 ng/mL (ref 4.8–23.3)
TSH: 4.83 u[IU]/mL — ABNORMAL HIGH (ref 0.450–4.500)
Testosterone, Free: 6 pg/mL — ABNORMAL HIGH (ref 0.0–4.2)
Testosterone: 67 ng/dL — ABNORMAL HIGH (ref 8–48)

## 2019-04-20 LAB — HEMOGLOBIN A1C
Est. average glucose Bld gHb Est-mCnc: 108 mg/dL
Hgb A1c MFr Bld: 5.4 % (ref 4.8–5.6)

## 2019-04-28 ENCOUNTER — Encounter: Payer: Self-pay | Admitting: Obstetrics and Gynecology

## 2019-04-28 DIAGNOSIS — E282 Polycystic ovarian syndrome: Secondary | ICD-10-CM | POA: Insufficient documentation

## 2019-04-28 DIAGNOSIS — R7989 Other specified abnormal findings of blood chemistry: Secondary | ICD-10-CM | POA: Insufficient documentation

## 2019-06-14 ENCOUNTER — Ambulatory Visit (INDEPENDENT_AMBULATORY_CARE_PROVIDER_SITE_OTHER): Payer: 59

## 2019-06-14 ENCOUNTER — Other Ambulatory Visit: Payer: Self-pay

## 2019-06-14 ENCOUNTER — Telehealth: Payer: Self-pay

## 2019-06-14 ENCOUNTER — Other Ambulatory Visit: Payer: Self-pay | Admitting: Obstetrics and Gynecology

## 2019-06-14 DIAGNOSIS — Z23 Encounter for immunization: Secondary | ICD-10-CM | POA: Diagnosis not present

## 2019-06-14 MED ORDER — TAYTULLA 1-20 MG-MCG(24) PO CAPS: 1.0000 | ORAL_CAPSULE | Freq: Every day | ORAL | 3 refills | Status: DC

## 2019-06-14 NOTE — Telephone Encounter (Signed)
Pt called; has one pack of bcp left.  Does she come p/u more samples?  [phone]

## 2019-06-14 NOTE — Progress Notes (Signed)
Pt here for 2nd gardisil which was given IM right deltoid.  NDC# [id number]

## 2019-06-14 NOTE — Telephone Encounter (Signed)
Rx has been sent  

## 2019-06-14 NOTE — Telephone Encounter (Signed)
While pt was here for gardisil inj, she adv me she was told by her provider to let him know if she has a period on the bc he rx'd.  She had a period 5/8-27/20 and started on the 16th of this month.  Flow is light this month.

## 2019-06-15 NOTE — Telephone Encounter (Signed)
Left detailed msg on vm.

## 2019-06-22 ENCOUNTER — Other Ambulatory Visit: Payer: Self-pay

## 2019-06-22 MED ORDER — TAYTULLA 1-20 MG-MCG(24) PO CAPS: 1.0000 | ORAL_CAPSULE | Freq: Every day | ORAL | 3 refills | Status: DC

## 2019-06-22 NOTE — Telephone Encounter (Signed)
Patient is calling about her birth control not being at her pharmacy at [place] on [address]. Could you please look into this a notify patient.

## 2019-06-22 NOTE — Telephone Encounter (Signed)
Pt aware rx resent to pharm and pharm received it.

## 2019-07-02 ENCOUNTER — Telehealth: Payer: Self-pay

## 2019-07-02 NOTE — Telephone Encounter (Signed)
Pt called after hour nurse to talk to her doctor to change her birth control.  [phone]  Pt aware [facility] is out of the office this week and will return this week.  She states she went to p/u her 87m supply and it was way too expensive at $538.  Adv pt to ck with her ins to see what they will cover and call us back with the list.

## 2019-07-02 NOTE — Telephone Encounter (Signed)
Pt returned call stating in will cover Tier I; one name given was Aurovela; pt has six pills left and wants to be sure there is no lapse.  [phone]

## 2019-07-02 NOTE — Telephone Encounter (Signed)
Pt called after hour nurse stating she wanted to talk to her dr about changing her birth control.  [phone]   Has 8 pills left.  She went to p/u rx and it was %38to

## 2019-07-02 NOTE — Telephone Encounter (Signed)
Advise on new Rx

## 2019-07-05 ENCOUNTER — Other Ambulatory Visit: Payer: Self-pay | Admitting: Obstetrics and Gynecology

## 2019-07-05 MED ORDER — NORETHINDRONE ACET-ETHINYL EST 1-20 MG-MCG PO TABS: 1.0000 | ORAL_TABLET | Freq: Every day | ORAL | 3 refills | Status: DC

## 2019-07-05 NOTE — Telephone Encounter (Signed)
Aurovela called in

## 2019-07-16 ENCOUNTER — Other Ambulatory Visit: Payer: Self-pay

## 2019-07-16 MED ORDER — VITAMIN D (ERGOCALCIFEROL) 1.25 MG (50000 UNIT) PO CAPS: 50000.0000 [IU] | ORAL_CAPSULE | ORAL | 0 refills | Status: DC

## 2019-07-30 ENCOUNTER — Other Ambulatory Visit: Payer: Self-pay

## 2019-07-30 DIAGNOSIS — L500 Allergic urticaria: Secondary | ICD-10-CM

## 2019-07-30 MED ORDER — HYDROXYZINE HCL 25 MG PO TABS: ORAL_TABLET | ORAL | 0 refills | Status: AC

## 2019-10-06 ENCOUNTER — Other Ambulatory Visit: Payer: Self-pay

## 2019-10-06 DIAGNOSIS — Z20822 Contact with and (suspected) exposure to covid-19: Secondary | ICD-10-CM

## 2019-10-07 LAB — NOVEL CORONAVIRUS, NAA: SARS-CoV-2, NAA: NOT DETECTED

## 2019-10-14 ENCOUNTER — Ambulatory Visit: Payer: 59

## 2019-10-22 ENCOUNTER — Other Ambulatory Visit: Payer: Self-pay | Admitting: *Deleted

## 2019-10-22 DIAGNOSIS — Z20822 Contact with and (suspected) exposure to covid-19: Secondary | ICD-10-CM

## 2019-10-24 LAB — NOVEL CORONAVIRUS, NAA: SARS-CoV-2, NAA: DETECTED — AB

## 2019-10-26 ENCOUNTER — Ambulatory Visit: Payer: Self-pay

## 2019-11-11 ENCOUNTER — Telehealth: Payer: Self-pay

## 2019-11-11 NOTE — Telephone Encounter (Signed)
Pt's mom, Vaughan Basta, calling for verification on how pt is supposed to take her bcp.  Pharm says she is not taking it correctly as she starts a new pack the day after she finishes a pack.  She doesn't stay off of them for a week to have a period.  Mom states this defeats the purpose of her being rx'd bcp in the first place.  [place] pt is to take one pill a day and at the end of the pack to stop pills and have a period then start new pack at the end of the week off.  Vaughan Basta voiced understanding.

## 2020-01-05 ENCOUNTER — Other Ambulatory Visit: Payer: Self-pay | Admitting: Obstetrics and Gynecology

## 2020-01-05 NOTE — Telephone Encounter (Signed)
Advise

## 2020-01-14 ENCOUNTER — Telehealth: Payer: Self-pay | Admitting: Obstetrics and Gynecology

## 2020-01-14 ENCOUNTER — Telehealth: Payer: Self-pay

## 2020-01-14 NOTE — Telephone Encounter (Signed)
Patient is schedule for 01/19/20 for Gardisil # 3 injection. Patient is stating her birthcontrol is not at the pharmacy and would like a refill sent to [facility] on [address]. Please advise

## 2020-01-14 NOTE — Telephone Encounter (Signed)
Can you call pt or her mother to schedule her gardisil shot, she needs her 3rd one

## 2020-01-14 NOTE — Telephone Encounter (Signed)
Patient is schedule for 01/19/20 at 11

## 2020-01-19 ENCOUNTER — Other Ambulatory Visit: Payer: Self-pay

## 2020-01-19 ENCOUNTER — Ambulatory Visit (INDEPENDENT_AMBULATORY_CARE_PROVIDER_SITE_OTHER): Payer: 59

## 2020-01-19 ENCOUNTER — Ambulatory Visit (LOCAL_COMMUNITY_HEALTH_CENTER): Payer: Self-pay

## 2020-01-19 DIAGNOSIS — Z23 Encounter for immunization: Secondary | ICD-10-CM

## 2020-01-19 NOTE — Progress Notes (Signed)
Per client, currently under MD care for irregular / issues with menses. Immunization pre-paid form sent to R. Sharen Hones in [employer]). Jossie Ng, RN

## 2020-01-19 NOTE — Progress Notes (Signed)
Pt here for gardasil which was given IM right deltoid.  NDC# [phone]

## 2020-02-01 ENCOUNTER — Ambulatory Visit: Payer: 59 | Admitting: Obstetrics and Gynecology

## 2020-02-16 ENCOUNTER — Ambulatory Visit: Payer: 59 | Admitting: Obstetrics and Gynecology

## 2020-09-27 ENCOUNTER — Telehealth: Payer: Self-pay

## 2020-09-27 ENCOUNTER — Other Ambulatory Visit: Payer: Self-pay | Admitting: Obstetrics and Gynecology

## 2020-09-27 NOTE — Telephone Encounter (Signed)
Pt calling; needs refill on birthcontrol.  [phone]

## 2020-09-27 NOTE — Telephone Encounter (Signed)
Called and left voicemail for patient to call back to be scheduled. 

## 2020-10-02 MED ORDER — NORETHINDRONE ACET-ETHINYL EST 1-20 MG-MCG PO TABS: 1.0000 | ORAL_TABLET | Freq: Every day | ORAL | 0 refills | Status: DC

## 2020-10-02 NOTE — Telephone Encounter (Signed)
Patient requesting refill to get to her appointment

## 2020-10-02 NOTE — Telephone Encounter (Signed)
Called and left voicemail for patient to call back to be scheduled. 

## 2020-10-02 NOTE — Telephone Encounter (Signed)
RF sent.

## 2020-10-02 NOTE — Telephone Encounter (Signed)
Patient is scheduled for 10/18/20 at 1:30 for annual.

## 2020-10-02 NOTE — Addendum Note (Signed)
Addended by: Donnetta Hail on: 10/02/2020 03:49 PM   Modules accepted: Orders

## 2020-10-16 ENCOUNTER — Telehealth: Payer: Self-pay

## 2020-10-16 NOTE — Telephone Encounter (Signed)
Pt left msg on triage requesting her appt for tomorrow to be r/s. Pls contact pt to r/s

## 2020-10-18 ENCOUNTER — Ambulatory Visit: Payer: 59 | Admitting: Obstetrics and Gynecology

## 2020-10-18 NOTE — Telephone Encounter (Signed)
Appointment for 10/18/20 was cancelled. Called and left voicemail for patient to call back to be scheduled.

## 2020-10-18 NOTE — Telephone Encounter (Signed)
Appint

## 2021-02-02 ENCOUNTER — Other Ambulatory Visit: Payer: Self-pay | Admitting: Obstetrics and Gynecology

## 2021-03-19 ENCOUNTER — Ambulatory Visit: Payer: 59 | Admitting: Internal Medicine

## 2021-04-17 ENCOUNTER — Ambulatory Visit: Payer: 59 | Admitting: Internal Medicine

## 2021-05-02 ENCOUNTER — Other Ambulatory Visit
Admission: RE | Admit: 2021-05-02 | Discharge: 2021-05-02 | Disposition: A | Discharge status: Home / Self Care | Payer: 59 | Source: Ambulatory Visit | Attending: Internal Medicine | Admitting: Internal Medicine

## 2021-05-02 ENCOUNTER — Telehealth: Payer: Self-pay

## 2021-05-02 ENCOUNTER — Encounter: Payer: Self-pay | Admitting: Nurse Practitioner

## 2021-05-02 ENCOUNTER — Ambulatory Visit: Payer: 59 | Admitting: Internal Medicine

## 2021-05-02 VITALS — BP 136/88 | HR 107 | Temp 97.3°F | Resp 16 | Ht 67.0 in | Wt 260.4 lb

## 2021-05-02 DIAGNOSIS — H9212 Otorrhea, left ear: Secondary | ICD-10-CM | POA: Diagnosis not present

## 2021-05-02 DIAGNOSIS — Z6841 Body Mass Index (BMI) 40.0 and over, adult: Secondary | ICD-10-CM

## 2021-05-02 DIAGNOSIS — R5383 Other fatigue: Secondary | ICD-10-CM

## 2021-05-02 DIAGNOSIS — E282 Polycystic ovarian syndrome: Secondary | ICD-10-CM | POA: Diagnosis not present

## 2021-05-02 DIAGNOSIS — E039 Hypothyroidism, unspecified: Secondary | ICD-10-CM | POA: Diagnosis not present

## 2021-05-02 LAB — BASIC METABOLIC PANEL
Anion gap: 10 (ref 5–15)
BUN: 12 mg/dL (ref 6–20)
CO2: 23 mmol/L (ref 22–32)
Calcium: 9 mg/dL (ref 8.9–10.3)
Chloride: 106 mmol/L (ref 98–111)
Creatinine, Ser: 0.79 mg/dL (ref 0.44–1.00)
GFR, Estimated: >60 mL/min (ref >60)
Glucose, Bld: 99 mg/dL (ref 70–99)
Potassium: 3.8 mmol/L (ref 3.5–5.1)
Sodium: 139 mmol/L (ref 135–145)

## 2021-05-02 LAB — FERRITIN: Ferritin: 48 ng/mL (ref 11–307)

## 2021-05-02 LAB — CBC WITH DIFFERENTIAL/PLATELET
Abs Immature Granulocytes: 0.01 10*3/uL (ref 0.00–0.07)
Basophils Absolute: 0 10*3/uL (ref 0.0–0.1)
Basophils Relative: 0 %
Eosinophils Absolute: 0 10*3/uL (ref 0.0–0.5)
Eosinophils Relative: 0 %
HCT: 47.3 % — ABNORMAL HIGH (ref 36.0–46.0)
Hemoglobin: 16.1 g/dL — ABNORMAL HIGH (ref 12.0–15.0)
Immature Granulocytes: 0 %
Lymphocytes Relative: 42 %
Lymphs Abs: 2.2 10*3/uL (ref 0.7–4.0)
MCH: 30.3 pg (ref 26.0–34.0)
MCHC: 34 g/dL (ref 30.0–36.0)
MCV: 88.9 fL (ref 80.0–100.0)
Monocytes Absolute: 0.8 10*3/uL (ref 0.1–1.0)
Monocytes Relative: 15 %
Neutro Abs: 2.3 10*3/uL (ref 1.7–7.7)
Neutrophils Relative %: 43 %
Platelets: 284 10*3/uL (ref 150–400)
RBC: 5.32 MIL/uL — ABNORMAL HIGH (ref 3.87–5.11)
RDW: 12.8 % (ref 11.5–15.5)
WBC: 5.3 10*3/uL (ref 4.0–10.5)
nRBC: 0 % (ref 0.0–0.2)

## 2021-05-02 LAB — TSH: TSH: 2.754 u[IU]/mL (ref 0.350–4.500)

## 2021-05-02 LAB — VITAMIN B12: Vitamin B-12: 432 pg/mL (ref 180–914)

## 2021-05-02 LAB — IRON: Iron: 30 ug/dL (ref 28–170)

## 2021-05-02 LAB — FOLATE: Folate: 8.5 ng/mL (ref >5.9)

## 2021-05-02 NOTE — Progress Notes (Signed)
Add on ferritin and iron

## 2021-05-02 NOTE — Progress Notes (Signed)
[facility] [address] [place], [place] [address]  Internal MEDICINE  Office Visit Note  Patient Name: Grace Buchanan  [id number]  [id number]  Date of Service: 05/09/2021  Chief Complaint  Patient presents with  . Follow-up    Weight loss, sometimes blood will come out of ears when barley using q-tip, has happened off and on since Jan.   . Quality Metric Gaps    pap    HPI  Patient is here with her family for routine follow-up and is very interested in weight management.  Her main complaints today is having discharge from ears sometimes bloody and it has been happening off and on since January.  She would like to see ENT. Patient had lab work couple of years ago which showed elevated TSH she has not had any repeat labs since then, off-and-on she also has problems with her cycle and is taking oral contraceptives She also gives history of possible polycystic ovarian disease, she has not really been on metformin.  Current Medication: Outpatient Encounter Medications as of 05/02/2021  Medication Sig  . hydrOXYzine (ATARAX/VISTARIL) 25 MG tablet Take 1 tablet by mouth daily as needed for allergic reaction/itching  . ibuprofen (ADVIL,MOTRIN) 600 MG tablet Take 1 tablet by mouth three times daily with meals  . JUNEL 1/20 1-20 MG-MCG tablet TAKE 1 TABLET BY MOUTH EVERY DAY  . [DISCONTINUED] Vitamin D, Ergocalciferol, (DRISDOL) 1.25 MG (50000 UT) CAPS capsule Take 1 capsule (50,000 Units total) by mouth every 7 (seven) days. (Patient not taking: Reported on 05/02/2021)   No facility-administered encounter medications on file as of 05/02/2021.    Surgical History: Past Surgical History:  Procedure Laterality Date  . BACK SURGERY    . TONSILECTOMY/ADENOIDECTOMY WITH MYRINGOTOMY Bilateral 12/31/2017    Medical History: Past Medical History:  Diagnosis Date  . Scoliosis     Family History: Family History  Problem Relation Age of Onset  . Hyperthyroidism Mother   .  Hypertension Mother   . Hypertension Father     Social History   Socioeconomic History  . Marital status: Single    Spouse name: Not on file  . Number of children: Not on file  . Years of education: Not on file  . Highest education level: Not on file  Occupational History  . Not on file  Tobacco Use  . Smoking status: Never Smoker  . Smokeless tobacco: Never Used  Vaping Use  . Vaping Use: Former  Substance and Sexual Activity  . Alcohol use: Yes    Comment: ocassionally  . Drug use: Yes    Types: Marijuana    Comment: occ.  Marland Kitchen Sexual activity: Never  Other Topics Concern  . Not on file  Social History Narrative  . Not on file   Social Determinants of Health   Financial Resource Strain: Not on file  Food Insecurity: Not on file  Transportation Needs: Not on file  Physical Activity: Not on file  Stress: Not on file  Social Connections: Not on file  Intimate Partner Violence: Not on file      Review of Systems  Constitutional: Negative for fatigue and fever.  HENT: Positive for congestion. Negative for mouth sores and postnasal drip.        Ear discharge   Respiratory: Negative for cough.   Cardiovascular: Negative for chest pain.  Genitourinary: Negative for flank pain.  Psychiatric/Behavioral: Negative.     Vital Signs: BP 136/88   Pulse (!) 107   Temp (!)  97.3 F (36.3 C)   Resp 16   Ht 5\' 7"  (1.702 m)   Wt 260 lb 6.4 oz (118.1 kg)   SpO2 96%   BMI 40.78 kg/m    Physical Exam Constitutional:      Appearance: Normal appearance.  HENT:     Head: Normocephalic and atraumatic.     Left Ear: There is impacted cerumen.     Ears:     Comments: Left ear discharge present     Nose: Nose normal.     Mouth/Throat:     Mouth: Mucous membranes are moist.     Pharynx: No posterior oropharyngeal erythema.  Eyes:     Extraocular Movements: Extraocular movements intact.     Pupils: Pupils are equal, round, and reactive to light.  Cardiovascular:      Pulses: Normal pulses.     Heart sounds: Normal heart sounds.  Pulmonary:     Effort: Pulmonary effort is normal.     Breath sounds: Normal breath sounds.  Neurological:     General: No focal deficit present.     Mental Status: She is alert.  Psychiatric:        Mood and Affect: Mood normal.        Behavior: Behavior normal.        Assessment/Plan: 1. BMI 40.0-44.9, adult [facility]) Patient has elevated BMI.  investigate further into hormonal/metabolic causes of weight gain.  Patient is given samples of Rybelsus 3 mg once a day.  Diet restricted between 1500-calorie.  About intermittent fasting and lean protein Complex carbohydrate intake - Metabolic Test - CBC with Differential/Platelet - B12 and Folate Panel  2. Drainage from left ear Patient excessive drainage from left ear we will send her to ENT - Ambulatory referral to ENT   3. PCOS (polycystic ovarian syndrome) Need to look into hormonal causes weight gain.  4. Hypothyroidism, unspecified type Patient had elevated TSH in 2020 we will repeat  5. Other fatigue Patient is ongoing fatigue we will get CBC and B12 folic acid level - CBC with Differential/Platelet - B12 and Folate Panel  General Counseling: Arzella verbalizes understanding of the findings of todays visit and agrees with plan of treatment. I have discussed any further diagnostic evaluation that may be needed or ordered today. We also reviewed her medications today. she has been encouraged to call the office with any questions or concerns that should arise related to todays visit.    Orders Placed This Encounter  Procedures  . Metabolic Test  . Basic Metabolic Panel (BMET)  . TSH + free T4  . Prolactin  . FSH/LH  . CBC with Differential/Platelet  . B12 and Folate Panel  . Ambulatory referral to ENT    No orders of the defined types were placed in this encounter.   Total time spent:30 Minutes Time spent includes review of chart, medications, test  results, and follow up plan with the patient.   [place] Controlled Substance Database was reviewed by me.   Dr 2021 Internal medicine

## 2021-05-02 NOTE — Telephone Encounter (Signed)
Called hospital labs for add on ferritin and IRON

## 2021-05-03 LAB — FSH/LH
FSH: 6.9 m[IU]/mL
LH: 10.8 m[IU]/mL

## 2021-05-03 LAB — T4: T4, Total: 8.8 ug/dL (ref 4.5–12.0)

## 2021-05-03 LAB — PROLACTIN: Prolactin: 7.6 ng/mL (ref 4.8–23.3)

## 2021-05-07 ENCOUNTER — Encounter: Payer: Self-pay | Admitting: Internal Medicine

## 2021-05-08 NOTE — Telephone Encounter (Signed)
Please review

## 2021-05-10 ENCOUNTER — Other Ambulatory Visit: Payer: Self-pay

## 2021-05-10 ENCOUNTER — Telehealth: Payer: Self-pay

## 2021-05-10 DIAGNOSIS — E039 Hypothyroidism, unspecified: Secondary | ICD-10-CM

## 2021-05-10 NOTE — Telephone Encounter (Signed)
Per DFK I called [facility] lab and checked why a Free T4 was not done and they had no response.  The lab advised that the test was done last wed and they only keep the specimens one week,  I ordered the new lab test and called and spoke to pt's mother and informed her that we sent new lab order and to have pt get done soon as possible.

## 2021-05-30 ENCOUNTER — Ambulatory Visit: Payer: 59 | Admitting: Nurse Practitioner

## 2021-06-12 ENCOUNTER — Ambulatory Visit: Payer: 59 | Admitting: Nurse Practitioner

## 2021-10-25 ENCOUNTER — Telehealth: Payer: Self-pay

## 2021-10-25 NOTE — Telephone Encounter (Signed)
Pt calling; has questions before scheduling an appt.  [phone]  Pt is on bcp; her nipples are hurting; periods are irregular.  Preg tests at home are faintly pink after time to read it is over.  Adv to repeat preg test in 1-2 weeks; needs to schedule for annual either way.

## 2021-10-26 NOTE — Telephone Encounter (Signed)
Pt is scheduled for an annual exam on 12/7 with AMS.

## 2021-11-20 ENCOUNTER — Other Ambulatory Visit: Payer: Self-pay | Admitting: Obstetrics and Gynecology

## 2021-11-28 ENCOUNTER — Ambulatory Visit: Payer: 59 | Admitting: Obstetrics and Gynecology

## 2021-12-14 ENCOUNTER — Encounter: Payer: Self-pay | Admitting: Nurse Practitioner

## 2021-12-14 ENCOUNTER — Telehealth: Payer: 59 | Admitting: Nurse Practitioner

## 2021-12-14 VITALS — BP 118/72 | HR 104 | Temp 101.3°F | Resp 16 | Ht 67.0 in | Wt 240.0 lb

## 2021-12-14 DIAGNOSIS — J0110 Acute frontal sinusitis, unspecified: Secondary | ICD-10-CM | POA: Diagnosis not present

## 2021-12-14 DIAGNOSIS — R051 Acute cough: Secondary | ICD-10-CM

## 2021-12-14 MED ORDER — AMOXICILLIN-POT CLAVULANATE 875-125 MG PO TABS: 1.0000 | ORAL_TABLET | Freq: Two times a day (BID) | ORAL | 0 refills | Status: AC

## 2021-12-14 MED ORDER — BENZONATATE 100 MG PO CAPS
100.0000 mg | ORAL_CAPSULE | Freq: Two times a day (BID) | ORAL | 0 refills | Status: AC | PRN
Start: 2021-12-14 — End: ?

## 2021-12-14 NOTE — Progress Notes (Signed)
[facility] [address] [place], [place] [address]  Internal MEDICINE  Telephone Visit  Patient Name: Grace Buchanan  [id number]  [id number]  Date of Service: 12/14/2021  I connected with the patient at 10:45 AM by telephone and verified the patients identity using two identifiers.   I discussed the limitations, risks, security and privacy concerns of performing an evaluation and management service by telephone and the availability of in person appointments. I also discussed with the patient that there may be a patient responsible charge related to the service.  The patient expressed understanding and agrees to proceed.    Chief Complaint  Patient presents with   Acute Visit    Neg covid test, when pt coughs or blows nose head is pounding    Telephone Assessment    Prefers phone call, bad Internet connections sometimes    Telephone Screen    [phone]   Fever   Ear Pain   Sore Throat   Sinusitis   Cough    HPI Grace Buchanan presents for a telehealth virtual visit for symptoms of sinusitis. She had a negative covid test. She reports fever, ear pain, sore throat, cough, and headache.    Current Medication: Outpatient Encounter Medications as of 12/14/2021  Medication Sig   amoxicillin-clavulanate (AUGMENTIN) 875-125 MG tablet Take 1 tablet by mouth 2 (two) times daily.   benzonatate (TESSALON) 100 MG capsule Take 1 capsule (100 mg total) by mouth 2 (two) times daily as needed for cough.   hydrOXYzine (ATARAX/VISTARIL) 25 MG tablet Take 1 tablet by mouth daily as needed for allergic reaction/itching   JUNEL 1/20 1-20 MG-MCG tablet TAKE 1 TABLET BY MOUTH EVERY DAY   [DISCONTINUED] ibuprofen (ADVIL,MOTRIN) 600 MG tablet Take 1 tablet by mouth three times daily with meals (Patient not taking: Reported on 12/14/2021)   No facility-administered encounter medications on file as of 12/14/2021.    Surgical History: Past Surgical History:  Procedure Laterality Date   BACK  SURGERY     TONSILECTOMY/ADENOIDECTOMY WITH MYRINGOTOMY Bilateral 12/31/2017    Medical History: Past Medical History:  Diagnosis Date   Scoliosis     Family History: Family History  Problem Relation Age of Onset   Hyperthyroidism Mother    Hypertension Mother    Hypertension Father     Social History   Socioeconomic History   Marital status: Single    Spouse name: Not on file   Number of children: Not on file   Years of education: Not on file   Highest education level: Not on file  Occupational History   Not on file  Tobacco Use   Smoking status: Never   Smokeless tobacco: Never  Vaping Use   Vaping Use: Former  Substance and Sexual Activity   Alcohol use: Yes    Comment: ocassionally   Drug use: Yes    Types: Marijuana    Comment: occ.   Sexual activity: Never  Other Topics Concern   Not on file  Social History Narrative   Not on file   Social Determinants of Health   Financial Resource Strain: Not on file  Food Insecurity: Not on file  Transportation Needs: Not on file  Physical Activity: Not on file  Stress: Not on file  Social Connections: Not on file  Intimate Partner Violence: Not on file      Review of Systems  Constitutional:  Positive for chills, fatigue and fever.  HENT:  Positive for congestion, ear pain, postnasal drip, rhinorrhea, sinus pressure,  sinus pain, sneezing and sore throat.   Eyes:  Negative for pain.  Respiratory:  Positive for cough. Negative for chest tightness, shortness of breath and wheezing.   Cardiovascular: Negative.  Negative for chest pain and palpitations.  Gastrointestinal:  Negative for abdominal pain, constipation, diarrhea, nausea and vomiting.  Musculoskeletal:  Positive for myalgias.  Skin:  Negative for rash.  Neurological:  Positive for headaches.   Vital Signs: BP 118/72    Pulse (!) 104    Temp (!) 101.3 F (38.5 C)    Resp 16    Ht 5\' 7"  (1.702 m)    Wt 240 lb (108.9 kg)    BMI 37.59 kg/m     Observation/Objective: She is alert and oriented and engages in conversation appropriately. She does not sound as though she is in any acute distress over telephone call.     Assessment/Plan: 1. Acute non-recurrent frontal sinusitis Empiric antibiotic treatment prescribed - amoxicillin-clavulanate (AUGMENTIN) 875-125 MG tablet; Take 1 tablet by mouth 2 (two) times daily.  Dispense: 20 tablet; Refill: 0  2. Acute cough Medication prescribed for symptomatic treatment of cough - benzonatate (TESSALON) 100 MG capsule; Take 1 capsule (100 mg total) by mouth 2 (two) times daily as needed for cough.  Dispense: 20 capsule; Refill: 0   General Counseling: Grace Buchanan verbalizes understanding of the findings of today's phone visit and agrees with plan of treatment. I have discussed any further diagnostic evaluation that may be needed or ordered today. We also reviewed her medications today. she has been encouraged to call the office with any questions or concerns that should arise related to todays visit.  Return if symptoms worsen or fail to improve.   No orders of the defined types were placed in this encounter.   Meds ordered this encounter  Medications   amoxicillin-clavulanate (AUGMENTIN) 875-125 MG tablet    Sig: Take 1 tablet by mouth 2 (two) times daily.    Dispense:  20 tablet    Refill:  0   benzonatate (TESSALON) 100 MG capsule    Sig: Take 1 capsule (100 mg total) by mouth 2 (two) times daily as needed for cough.    Dispense:  20 capsule    Refill:  0    Time spent:10 Minutes Time spent with patient included reviewing progress notes, labs, imaging studies, and discussing plan for follow up.  [place] Controlled Substance Database was reviewed by me for overdose risk score (ORS) if appropriate.  This patient was seen by , FNP-C in collaboration with Dr. Sallyanne Kuster as a part of collaborative care agreement.  Grethel Zenk R. Beverely Risen, MSN, FNP-C Internal medicine

## 2021-12-18 ENCOUNTER — Telehealth: Payer: 59 | Admitting: Nurse Practitioner

## 2022-01-03 ENCOUNTER — Ambulatory Visit: Payer: 59 | Admitting: Obstetrics and Gynecology

## 2022-06-07 ENCOUNTER — Ambulatory Visit: Payer: 59 | Admitting: Physician Assistant

## 2022-09-21 ENCOUNTER — Emergency Department (HOSPITAL_COMMUNITY)
Admission: EM | Admit: 2022-09-21 | Discharge: 2022-09-22 | Disposition: A | Discharge status: Home / Self Care | Payer: Commercial Managed Care - HMO | Attending: Emergency Medicine | Admitting: Emergency Medicine

## 2022-09-21 ENCOUNTER — Encounter (HOSPITAL_COMMUNITY): Payer: Self-pay | Admitting: *Deleted

## 2022-09-21 ENCOUNTER — Other Ambulatory Visit: Payer: Self-pay

## 2022-09-21 DIAGNOSIS — H9202 Otalgia, left ear: Secondary | ICD-10-CM | POA: Diagnosis present

## 2022-09-21 DIAGNOSIS — H66002 Acute suppurative otitis media without spontaneous rupture of ear drum, left ear: Secondary | ICD-10-CM

## 2022-09-21 DIAGNOSIS — H66005 Acute suppurative otitis media without spontaneous rupture of ear drum, recurrent, left ear: Secondary | ICD-10-CM | POA: Insufficient documentation

## 2022-09-21 MED ORDER — AMOXICILLIN 500 MG PO CAPS: 500.0000 mg | ORAL_CAPSULE | Freq: Three times a day (TID) | ORAL | 0 refills | Status: AC

## 2022-09-21 MED ORDER — AMOXICILLIN 250 MG PO CAPS
500.0000 mg | ORAL_CAPSULE | Freq: Once | ORAL | Status: AC
  Administered 2022-09-22: 500 mg via ORAL
  Filled 2022-09-21: qty 2

## 2022-09-21 NOTE — ED Triage Notes (Signed)
Pt with bilateral ear pain earlier today.  Pt states right ear is better but left ear is worse. + ringing in the ear, "pulsing" +nausea. Denies any fever

## 2022-09-21 NOTE — ED Provider Notes (Signed)
  [facility] EMERGENCY DEPARTMENT Provider Note   CSN: [id number] Arrival date & time: 09/21/22  2138     History  Chief Complaint  Patient presents with   Otalgia    Grace Buchanan is a 26 y.o. female.  Patient is a 26 year old female presenting with complaints of left ear pain.  She has had URI symptoms for the past several days, then her ear began hurting this morning.  She has been taking ibuprofen with some relief, but the pain returns.  This evening she describes decreased hearing.  She has had ear infections in the past and this feels similar.  She denies any drainage.  The history is provided by the patient.       Home Medications Prior to Admission medications   Medication Sig Start Date End Date Taking? Authorizing Provider  amoxicillin-clavulanate (AUGMENTIN) 875-125 MG tablet Take 1 tablet by mouth 2 (two) times daily. 12/14/21   Jonetta Osgood, NP  benzonatate (TESSALON) 100 MG capsule Take 1 capsule (100 mg total) by mouth 2 (two) times daily as needed for cough. 12/14/21   Jonetta Osgood, NP  hydrOXYzine (ATARAX/VISTARIL) 25 MG tablet Take 1 tablet by mouth daily as needed for allergic reaction/itching 07/30/19   Ronnell Freshwater, NP  JUNEL 1/20 1-20 MG-MCG tablet TAKE 1 TABLET BY MOUTH EVERY DAY 11/22/21   Malachy Mood, MD      Allergies    Gluten meal, Shellfish allergy, Dust mite extract, Fish allergy, and Propofol    Review of Systems   Review of Systems  All other systems reviewed and are negative.   Physical Exam Updated Vital Signs BP (!) 155/97 (BP Location: Left Arm)   Pulse 90   Temp 98.2 F (36.8 C) (Oral)   Resp 16   Ht 5\' 8"  (1.727 m)   Wt 122.5 kg   LMP 08/02/2022   SpO2 100%   BMI 41.05 kg/m  Physical Exam Vitals and nursing note reviewed.  Constitutional:      General: She is not in acute distress.    Appearance: Normal appearance. She is not ill-appearing.  HENT:     Head: Normocephalic and atraumatic.     Ears:      Comments: Left TM is erythematous and bulging.  Ear canal is unremarkable. Pulmonary:     Effort: Pulmonary effort is normal.  Skin:    General: Skin is warm and dry.  Neurological:     Mental Status: She is alert.     ED Results / Procedures / Treatments   Labs (all labs ordered are listed, but only abnormal results are displayed) Labs Reviewed - No data to display  EKG None  Radiology No results found.  Procedures Procedures    Medications Ordered in ED Medications  amoxicillin (AMOXIL) capsule 500 mg (has no administration in time range)    ED Course/ Medical Decision Making/ A&P  Patient with left otitis media.  To be treated with amoxicillin and as needed return.  Final Clinical Impression(s) / ED Diagnoses Final diagnoses:  None    Rx / DC Orders ED Discharge Orders     None         Veryl Speak, MD 09/21/22 2356

## 2022-09-21 NOTE — Discharge Instructions (Addendum)
Begin taking amoxicillin as prescribed.  Take ibuprofen 600 mg every 6 hours as needed for pain.  Follow-up with primary doctor for recheck in the next 2 weeks, and return to the ER if symptoms significantly worsen or change.

## 2022-09-30 ENCOUNTER — Emergency Department (HOSPITAL_COMMUNITY)
Admission: EM | Admit: 2022-09-30 | Discharge: 2022-10-01 | Disposition: A | Discharge status: Home / Self Care | Payer: Commercial Managed Care - HMO | Attending: Emergency Medicine | Admitting: Emergency Medicine

## 2022-09-30 DIAGNOSIS — R112 Nausea with vomiting, unspecified: Secondary | ICD-10-CM | POA: Diagnosis present

## 2022-09-30 DIAGNOSIS — D72829 Elevated white blood cell count, unspecified: Secondary | ICD-10-CM | POA: Diagnosis not present

## 2022-09-30 DIAGNOSIS — R55 Syncope and collapse: Secondary | ICD-10-CM | POA: Diagnosis not present

## 2022-09-30 DIAGNOSIS — W228XXA Striking against or struck by other objects, initial encounter: Secondary | ICD-10-CM | POA: Diagnosis not present

## 2022-09-30 DIAGNOSIS — R197 Diarrhea, unspecified: Secondary | ICD-10-CM | POA: Diagnosis not present

## 2022-09-30 NOTE — ED Notes (Signed)
Per family pt also passed out and hit her head on the toilet as she bent down to vomit.

## 2022-09-30 NOTE — ED Triage Notes (Signed)
Pt arrived from home via [facility] states that she ate chicken approx 9pm 09/29/22. Pt states that she began vomiting at 1300 today x5.

## 2022-10-01 LAB — CBC WITH DIFFERENTIAL/PLATELET
Abs Immature Granulocytes: 0.06 10*3/uL (ref 0.00–0.07)
Basophils Absolute: 0 10*3/uL (ref 0.0–0.1)
Basophils Relative: 0 %
Eosinophils Absolute: 0 10*3/uL (ref 0.0–0.5)
Eosinophils Relative: 0 %
HCT: 42.5 % (ref 36.0–46.0)
Hemoglobin: 14.5 g/dL (ref 12.0–15.0)
Immature Granulocytes: 1 %
Lymphocytes Relative: 8 %
Lymphs Abs: 1 10*3/uL (ref 0.7–4.0)
MCH: 30.9 pg (ref 26.0–34.0)
MCHC: 34.1 g/dL (ref 30.0–36.0)
MCV: 90.6 fL (ref 80.0–100.0)
Monocytes Absolute: 0.5 10*3/uL (ref 0.1–1.0)
Monocytes Relative: 4 %
Neutro Abs: 11.5 10*3/uL — ABNORMAL HIGH (ref 1.7–7.7)
Neutrophils Relative %: 87 %
Platelets: 362 10*3/uL (ref 150–400)
RBC: 4.69 MIL/uL (ref 3.87–5.11)
RDW: 12 % (ref 11.5–15.5)
WBC: 13.1 10*3/uL — ABNORMAL HIGH (ref 4.0–10.5)
nRBC: 0 % (ref 0.0–0.2)

## 2022-10-01 LAB — COMPREHENSIVE METABOLIC PANEL
ALT: 28 U/L (ref 0–44)
AST: 20 U/L (ref 15–41)
Albumin: 3.7 g/dL (ref 3.5–5.0)
Alkaline Phosphatase: 50 U/L (ref 38–126)
Anion gap: 8 (ref 5–15)
BUN: 10 mg/dL (ref 6–20)
CO2: 23 mmol/L (ref 22–32)
Calcium: 7.9 mg/dL — ABNORMAL LOW (ref 8.9–10.3)
Chloride: 105 mmol/L (ref 98–111)
Creatinine, Ser: 0.71 mg/dL (ref 0.44–1.00)
GFR, Estimated: >60 mL/min (ref >60)
Glucose, Bld: 101 mg/dL — ABNORMAL HIGH (ref 70–99)
Potassium: 3.5 mmol/L (ref 3.5–5.1)
Sodium: 136 mmol/L (ref 135–145)
Total Bilirubin: 0.8 mg/dL (ref 0.3–1.2)
Total Protein: 7.1 g/dL (ref 6.5–8.1)

## 2022-10-01 LAB — URINALYSIS, ROUTINE W REFLEX MICROSCOPIC
Bilirubin Urine: NEGATIVE
Glucose, UA: NEGATIVE mg/dL
Ketones, ur: 20 mg/dL — AB
Nitrite: NEGATIVE
Protein, ur: NEGATIVE mg/dL
Specific Gravity, Urine: 1.018 (ref 1.005–1.030)
pH: 5 (ref 5.0–8.0)

## 2022-10-01 LAB — LIPASE, BLOOD: Lipase: 32 U/L (ref 11–51)

## 2022-10-01 LAB — POC URINE PREG, ED: Preg Test, Ur: NEGATIVE

## 2022-10-01 MED ORDER — ONDANSETRON 4 MG PO TBDP: 4.0000 mg | ORAL_TABLET | Freq: Three times a day (TID) | ORAL | 0 refills | Status: AC | PRN

## 2022-10-01 MED ORDER — SODIUM CHLORIDE 0.9 % IV BOLUS
1000.0000 mL | Freq: Once | INTRAVENOUS | Status: AC
  Administered 2022-10-01: 1000 mL via INTRAVENOUS

## 2022-10-01 NOTE — ED Provider Notes (Signed)
[facility]-EMERGENCY DEPT [facility] Emergency Department Provider Note MRN:  [id number]  Arrival date & time: 10/01/22     Chief Complaint   Emesis and Loss of Consciousness   History of Present Illness   Grace Buchanan is a 26 y.o. year-old female presents to the ED with chief complaint of syncopal episode.  Patient states that she has had nausea, vomiting, diarrhea today.  Had an episode where she was vomiting over the toilet and passed out and hit her forehead on the toilet.  She denies being in any pain now.  She states that she still feels a bit nauseated.  History provided by patient.   Review of Systems  Pertinent positive and negative review of systems noted in HPI.    Physical Exam   Vitals:   10/01/22 0115 10/01/22 0300  BP: 119/69   Pulse: 70 68  Resp: 18 (!) 30  Temp:    SpO2: 100% 98%    CONSTITUTIONAL:  non toxic-appearing, NAD NEURO:  Alert and oriented x 3, CN 3-12 grossly intact EYES:  eyes equal and reactive ENT/NECK:  Supple, no stridor  CARDIO:  normal rate, regular rhythm, appears well-perfused  PULM:  No respiratory distress, CTAB GI/GU:  non-distended, no focal tenderness MSK/SPINE:  No gross deformities, no edema, moves all extremities  SKIN:  no rash, atraumatic   *Additional and/or pertinent findings included in MDM below  Diagnostic and Interventional Summary    EKG Interpretation  Date/Time:  Tuesday October 01 2022 02:00:56 EDT Ventricular Rate:  59 PR Interval:  107 QRS Duration: 95 QT Interval:  402 QTC Calculation: 399 R Axis:   42 Text Interpretation: Sinus rhythm Short PR interval Low voltage, precordial leads Baseline wander in lead(s) II III aVF V5 Confirmed by Geoffery Lyons ([id number]) on 10/01/2022 2:27:57 AM       Labs Reviewed  COMPREHENSIVE METABOLIC PANEL - Abnormal; Notable for the following components:      Result Value   Glucose, Bld 101 (*)    Calcium 7.9 (*)    All other components within normal limits  CBC  WITH DIFFERENTIAL/PLATELET - Abnormal; Notable for the following components:   WBC 13.1 (*)    Neutro Abs 11.5 (*)    All other components within normal limits  URINALYSIS, ROUTINE W REFLEX MICROSCOPIC - Abnormal; Notable for the following components:   APPearance HAZY (*)    Hgb urine dipstick MODERATE (*)    Ketones, ur 20 (*)    Leukocytes,Ua TRACE (*)    Bacteria, UA RARE (*)    All other components within normal limits  LIPASE, BLOOD  POC URINE PREG, ED    No orders to display    Medications  sodium chloride 0.9 % bolus 1,000 mL (1,000 mLs Intravenous New Bag/Given 10/01/22 0140)     Procedures  /  Critical Care Procedures  ED Course and Medical Decision Making  I have reviewed the triage vital signs, the nursing notes, and pertinent available records from the EMR.  Social Determinants Affecting Complexity of Care: Patient has no clinically significant social determinants affecting this chief complaint..   ED Course:    Medical Decision Making Patient here with syncopal event after having episode of vomiting.  She has had vomiting throughout the day today.  Suspicious for viral etiology.  Abdomen is soft and nontender.  Vital signs are stable.  She is nontoxic in appearance.  Patient reassessed, ambulates to the bathroom without difficulty.  Vitals are stable.  We will  plan for discharge and Zofran at home.  Amount and/or Complexity of Data Reviewed Labs: ordered.    Details: mild nonspecific leukocytosis to 13.1, but no focal abdominal tenderness, doubt surgical or acute abdomen.  no significant electrolyte derangement, negative pregnancy test, normal lipase, UA is inconsistent with UTI ECG/medicine tests: ordered and independent interpretation performed.    Details: Sinus rhythm   Risk Prescription drug management.     Consultants: No consultations were needed in caring for this patient.   Treatment and Plan: Emergency department workup does not suggest an  emergent condition requiring admission or immediate intervention beyond  what has been performed at this time. The patient is safe for discharge and has  been instructed to return immediately for worsening symptoms, change in  symptoms or any other concerns    Final Clinical Impressions(s) / ED Diagnoses     ICD-10-CM   1. Nausea vomiting and diarrhea  R11.2    R19.7       ED Discharge Orders          Ordered    ondansetron (ZOFRAN-ODT) 4 MG disintegrating tablet  Every 8 hours PRN        10/01/22 0306              Discharge Instructions Discussed with and Provided to Patient:   Discharge Instructions   None      Montine Circle, PA-C 10/01/22 [id number]    Veryl Speak, MD 10/01/22 [phone]

## 2022-10-01 NOTE — ED Notes (Signed)
Pt ambulated self to bathroom and back without issue

## 2022-11-18 ENCOUNTER — Other Ambulatory Visit: Payer: Self-pay

## 2022-11-18 MED ORDER — NORETHINDRONE ACET-ETHINYL EST 1-20 MG-MCG PO TABS: 1.0000 | ORAL_TABLET | Freq: Every day | ORAL | 0 refills | Status: DC

## 2022-12-19 ENCOUNTER — Other Ambulatory Visit
Admission: RE | Admit: 2022-12-19 | Discharge: 2022-12-19 | Disposition: A | Discharge status: Home / Self Care | Payer: Commercial Managed Care - HMO | Source: Ambulatory Visit | Attending: Emergency Medicine | Admitting: Emergency Medicine

## 2022-12-19 DIAGNOSIS — R55 Syncope and collapse: Secondary | ICD-10-CM | POA: Diagnosis present

## 2022-12-19 DIAGNOSIS — E559 Vitamin D deficiency, unspecified: Secondary | ICD-10-CM | POA: Insufficient documentation

## 2022-12-19 LAB — CBC
HCT: 44.5 % (ref 36.0–46.0)
Hemoglobin: 14.7 g/dL (ref 12.0–15.0)
MCH: 29.6 pg (ref 26.0–34.0)
MCHC: 33 g/dL (ref 30.0–36.0)
MCV: 89.7 fL (ref 80.0–100.0)
Platelets: 367 10*3/uL (ref 150–400)
RBC: 4.96 MIL/uL (ref 3.87–5.11)
RDW: 12.4 % (ref 11.5–15.5)
WBC: 8.1 10*3/uL (ref 4.0–10.5)
nRBC: 0 % (ref 0.0–0.2)

## 2022-12-19 LAB — COMPREHENSIVE METABOLIC PANEL
ALT: 40 U/L (ref 0–44)
AST: 26 U/L (ref 15–41)
Albumin: 4 g/dL (ref 3.5–5.0)
Alkaline Phosphatase: 52 U/L (ref 38–126)
Anion gap: 6 (ref 5–15)
BUN: 16 mg/dL (ref 6–20)
CO2: 28 mmol/L (ref 22–32)
Calcium: 8.8 mg/dL — ABNORMAL LOW (ref 8.9–10.3)
Chloride: 104 mmol/L (ref 98–111)
Creatinine, Ser: 0.75 mg/dL (ref 0.44–1.00)
GFR, Estimated: >60 mL/min (ref >60)
Glucose, Bld: 100 mg/dL — ABNORMAL HIGH (ref 70–99)
Potassium: 4.1 mmol/L (ref 3.5–5.1)
Sodium: 138 mmol/L (ref 135–145)
Total Bilirubin: 0.6 mg/dL (ref 0.3–1.2)
Total Protein: 7.2 g/dL (ref 6.5–8.1)

## 2022-12-19 LAB — LIPID PANEL
Cholesterol: 114 mg/dL (ref 0–200)
HDL: 40 mg/dL — ABNORMAL LOW (ref >40)
LDL Cholesterol: 64 mg/dL (ref 0–99)
Total CHOL/HDL Ratio: 2.9 RATIO
Triglycerides: 48 mg/dL (ref <150)
VLDL: 10 mg/dL (ref 0–40)

## 2022-12-19 LAB — FERRITIN: Ferritin: 46 ng/mL (ref 11–307)

## 2022-12-19 LAB — VITAMIN B12: Vitamin B-12: 396 pg/mL (ref 180–914)

## 2022-12-19 LAB — IRON AND TIBC
Iron: 114 ug/dL (ref 28–170)
Saturation Ratios: 30 % (ref 10.4–31.8)
TIBC: 381 ug/dL (ref 250–450)
UIBC: 267 ug/dL

## 2022-12-19 LAB — T4, FREE: Free T4: 0.66 ng/dL (ref 0.61–1.12)

## 2022-12-19 LAB — VITAMIN D 25 HYDROXY (VIT D DEFICIENCY, FRACTURES): Vit D, 25-Hydroxy: 18.45 ng/mL — ABNORMAL LOW (ref 30–100)

## 2022-12-19 LAB — TSH: TSH: 2.65 u[IU]/mL (ref 0.350–4.500)

## 2023-05-12 ENCOUNTER — Ambulatory Visit: Payer: Self-pay | Admitting: Obstetrics and Gynecology

## 2023-06-04 ENCOUNTER — Ambulatory Visit (INDEPENDENT_AMBULATORY_CARE_PROVIDER_SITE_OTHER): Payer: 59 | Admitting: Certified Nurse Midwife

## 2023-06-04 ENCOUNTER — Other Ambulatory Visit (HOSPITAL_COMMUNITY)
Admission: RE | Admit: 2023-06-04 | Discharge: 2023-06-04 | Disposition: A | Discharge status: Home / Self Care | Payer: 59 | Source: Ambulatory Visit | Attending: Obstetrics and Gynecology | Admitting: Obstetrics and Gynecology

## 2023-06-04 ENCOUNTER — Encounter: Payer: Self-pay | Admitting: Certified Nurse Midwife

## 2023-06-04 VITALS — BP 132/80 | HR 81 | Ht 67.0 in | Wt 277.0 lb

## 2023-06-04 DIAGNOSIS — Z30011 Encounter for initial prescription of contraceptive pills: Secondary | ICD-10-CM

## 2023-06-04 DIAGNOSIS — Z01419 Encounter for gynecological examination (general) (routine) without abnormal findings: Secondary | ICD-10-CM

## 2023-06-04 DIAGNOSIS — Z124 Encounter for screening for malignant neoplasm of cervix: Secondary | ICD-10-CM | POA: Diagnosis present

## 2023-06-04 DIAGNOSIS — Z113 Encounter for screening for infections with a predominantly sexual mode of transmission: Secondary | ICD-10-CM

## 2023-06-04 DIAGNOSIS — E282 Polycystic ovarian syndrome: Secondary | ICD-10-CM

## 2023-06-04 MED ORDER — DROSPIRENONE-ETHINYL ESTRADIOL 3-0.02 MG PO TABS: 1.0000 | ORAL_TABLET | Freq: Every day | ORAL | 3 refills | Status: DC

## 2023-06-04 NOTE — Patient Instructions (Signed)
Health Maintenance, Female Adopting a healthy lifestyle and getting preventive care are important in promoting health and wellness. Ask your health care provider about: The right schedule for you to have regular tests and exams. Things you can do on your own to prevent diseases and keep yourself healthy. What should I know about diet, weight, and exercise? Eat a healthy diet  Eat a diet that includes plenty of vegetables, fruits, low-fat dairy products, and lean protein. Do not eat a lot of foods that are high in solid fats, added sugars, or sodium. Maintain a healthy weight Body mass index (BMI) is used to identify weight problems. It estimates body fat based on height and weight. Your health care provider can help determine your BMI and help you achieve or maintain a healthy weight. Get regular exercise Get regular exercise. This is one of the most important things you can do for your health. Most adults should: Exercise for at least 150 minutes each week. The exercise should increase your heart rate and make you sweat (moderate-intensity exercise). Do strengthening exercises at least twice a week. This is in addition to the moderate-intensity exercise. Spend less time sitting. Even light physical activity can be beneficial. Watch cholesterol and blood lipids Have your blood tested for lipids and cholesterol at 25 years of age, then have this test every 5 years. Have your cholesterol levels checked more often if: Your lipid or cholesterol levels are high. You are older than 25 years of age. You are at high risk for heart disease. What should I know about cancer screening? Depending on your health history and family history, you may need to have cancer screening at various ages. This may include screening for: Breast cancer. Cervical cancer. Colorectal cancer. Skin cancer. Lung cancer. What should I know about heart disease, diabetes, and high blood pressure? Blood pressure and heart  disease High blood pressure causes heart disease and increases the risk of stroke. This is more likely to develop in people who have high blood pressure readings or are overweight. Have your blood pressure checked: Every 3-5 years if you are 18-39 years of age. Every year if you are 40 years old or older. Diabetes Have regular diabetes screenings. This checks your fasting blood sugar level. Have the screening done: Once every three years after age 40 if you are at a normal weight and have a low risk for diabetes. More often and at a younger age if you are overweight or have a high risk for diabetes. What should I know about preventing infection? Hepatitis B If you have a higher risk for hepatitis B, you should be screened for this virus. Talk with your health care provider to find out if you are at risk for hepatitis B infection. Hepatitis C Testing is recommended for: Everyone born from 1945 through 1965. Anyone with known risk factors for hepatitis C. Sexually transmitted infections (STIs) Get screened for STIs, including gonorrhea and chlamydia, if: You are sexually active and are younger than 24 years of age. You are older than 24 years of age and your health care provider tells you that you are at risk for this type of infection. Your sexual activity has changed since you were last screened, and you are at increased risk for chlamydia or gonorrhea. Ask your health care provider if you are at risk. Ask your health care provider about whether you are at high risk for HIV. Your health care provider may recommend a prescription medicine to help prevent HIV   infection. If you choose to take medicine to prevent HIV, you should first get tested for HIV. You should then be tested every 3 months for as long as you are taking the medicine. Pregnancy If you are about to stop having your period (premenopausal) and you may become pregnant, seek counseling before you get pregnant. Take 400 to 800  micrograms (mcg) of folic acid every day if you become pregnant. Ask for birth control (contraception) if you want to prevent pregnancy. Osteoporosis and menopause Osteoporosis is a disease in which the bones lose minerals and strength with aging. This can result in bone fractures. If you are 23 years old or older, or if you are at risk for osteoporosis and fractures, ask your health care provider if you should: Be screened for bone loss. Take a calcium or vitamin D supplement to lower your risk of fractures. Be given hormone replacement therapy (HRT) to treat symptoms of menopause. Follow these instructions at home: Alcohol use Do not drink alcohol if: Your health care provider tells you not to drink. You are pregnant, may be pregnant, or are planning to become pregnant. If you drink alcohol: Limit how much you have to: 0-1 drink a day. Know how much alcohol is in your drink. In the [place] one drink equals one 12 oz bottle of beer (355 mL), one 5 oz glass of wine (148 mL), or one 1 oz glass of hard liquor (44 mL). Lifestyle Do not use any products that contain nicotine or tobacco. These products include cigarettes, chewing tobacco, and vaping devices, such as e-cigarettes. If you need help quitting, ask your health care provider. Do not use street drugs. Do not share needles. Ask your health care provider for help if you need support or information about quitting drugs. General instructions Schedule regular health, dental, and eye exams. Stay current with your vaccines. Tell your health care provider if: You often feel depressed. You have ever been abused or do not feel safe at home. Summary Adopting a healthy lifestyle and getting preventive care are important in promoting health and wellness. Follow your health care provider's instructions about healthy diet, exercising, and getting tested or screened for diseases. Follow your health care provider's instructions on monitoring your  cholesterol and blood pressure. This information is not intended to replace advice given to you by your health care provider. Make sure you discuss any questions you have with your health care provider. Document Revised: 04/30/2021 Document Reviewed: 04/30/2021 Elsevier Patient Education  2024 Elsevier Inc.  American Heart Association [facility]) Exercise Recommendation  Being physically active is important to prevent heart disease and stroke, the nation's No. 1and No. 5killers. To improve overall cardiovascular health, we suggest at least 150 minutes per week of moderate exercise or 75 minutes per week of vigorous exercise (or a combination of moderate and vigorous activity). Thirty minutes a day, five times a week is an easy goal to remember. You will also experience benefits even if you divide your time into two or three segments of 10 to 15 minutes per day.  For people who would benefit from lowering their blood pressure or cholesterol, we recommend 40 minutes of aerobic exercise of moderate to vigorous intensity three to four times a week to lower the risk for heart attack and stroke.  Physical activity is anything that makes you move your body and burn calories.  This includes things like climbing stairs or playing sports. Aerobic exercises benefit your heart, and include walking, jogging, swimming or  biking. Strength and stretching exercises are best for overall stamina and flexibility.  The simplest, positive change you can make to effectively improve your heart health is to start walking. It's enjoyable, free, easy, social and great exercise. A walking program is flexible and boasts high success rates because people can stick with it. It's easy for walking to become a regular and satisfying part of life.   For Overall Cardiovascular Health: At least 30 minutes of moderate-intensity aerobic activity at least 5 days per week for a total of 150  OR  At least 25 minutes of vigorous aerobic  activity at least 3 days per week for a total of 75 minutes; or a combination of moderate- and vigorous-intensity aerobic activity  AND  Moderate- to high-intensity muscle-strengthening activity at least 2 days per week for additional health benefits.  For Lowering Blood Pressure and Cholesterol An average 40 minutes of moderate- to vigorous-intensity aerobic activity 3 or 4 times per week  What if I can't make it to the time goal? Something is always better than nothing! And everyone has to start somewhere. Even if you've been sedentary for years, today is the day you can begin to make healthy changes in your life. If you don't think you'll make it for 30 or 40 minutes, set a reachable goal for today. You can work up toward your overall goal by increasing your time as you get stronger. Don't let all-or-nothing thinking rob you of doing what you can every day.  Source:http://www.heart.org

## 2023-06-04 NOTE — Progress Notes (Signed)
ANNUAL EXAM Patient name: Grace Buchanan MRN [id number]  Date of birth: [birth year] Chief Complaint:   Annual Exam and Contraception (Periods are sill irregular on the pill)  History of Present Illness:   Grace Buchanan is a 27 y.o. G0P0000 Caucasian female being seen today for a routine annual exam.  Current complaints: irregular periods  No LMP recorded. Unsure of last cycle, taking birth control to control cycles, but thinks she has had 5 "normal" cycles in her life. Does not desire pregnancy at this time, does desire future childbearing. Unsure if she has had a pap.   The pregnancy intention screening data noted above was reviewed. Potential methods of contraception were discussed. The patient elected to proceed with No data recorded.           05/02/2021   11:03 AM 01/28/2019    9:37 AM 01/19/2018    3:17 PM  Depression screen PHQ 2/9  Decreased Interest 0 0 0  Down, Depressed, Hopeless 0 0 0  PHQ - 2 Score 0 0 0         No data to display           Review of Systems:   Pertinent items are noted in HPI Denies any headaches, blurred vision, fatigue, shortness of breath, chest pain, abdominal pain, abnormal vaginal discharge/itching/odor/irritation, problems with periods, bowel movements, urination, or intercourse unless otherwise stated above. Pertinent History Reviewed:  Reviewed past medical,surgical, social and family history.  Reviewed problem list, medications and allergies. Physical Assessment:   Vitals:   06/04/23 0838  BP: 132/80  Pulse: 81  Weight: 277 lb (125.6 kg)  Height: 5\' 7"  (1.702 m)  Body mass index is 43.38 kg/m.       Physical Exam Vitals reviewed.  Constitutional:      Appearance: Normal appearance. She is obese.  HENT:     Head: Normocephalic.  Cardiovascular:     Rate and Rhythm: Normal rate and regular rhythm.     Heart sounds: Normal heart sounds.  Pulmonary:     Effort: Pulmonary effort is normal.     Breath sounds: Normal  breath sounds.  Chest:  Breasts:    Tanner Score is 5.     Right: Normal.     Left: Normal.  Genitourinary:    General: Normal vulva.     Tanner stage (genital): 5.     Vagina: Normal.     Cervix: No cervical motion tenderness.     Uterus: Not enlarged and not tender.      Adnexa:        Right: No tenderness.         Left: No tenderness.    Musculoskeletal:     Cervical back: Normal range of motion and neck supple. No tenderness.  Skin:    General: Skin is warm and dry.  Neurological:     General: No focal deficit present.     Mental Status: She is alert and oriented to person, place, and time.      No results found for this or any previous visit (from the past 24 hour(s)).  Assessment & Plan:  Screening examination for venereal disease - Plan: Cervicovaginal ancillary only  Cervical cancer screening - Plan: Cytology - PAP  Encounter for initial prescription of contraceptive pills - Plan: drospirenone-ethinyl estradiol (YAZ) 3-0.02 MG tablet  PCOS (polycystic ovarian syndrome) - Plan: drospirenone-ethinyl estradiol (YAZ) 3-0.02 MG tablet  Well woman exam with routine gynecological exam  Will switch to  drospirenone containing COC. Had labwork with normal TSH in 12/23 so will defer labwork today. If new COC does not improve menses regularity would followup with further hormone assessment & consider ultrasound.  Labs/procedures today: No orders of the defined types were placed in this encounter.    Mammogram: @ 25yo, or sooner if problems Colonoscopy: @ 25yo, or sooner if problems  No orders of the defined types were placed in this encounter.   Meds:  Meds ordered this encounter  Medications   drospirenone-ethinyl estradiol (YAZ) 3-0.02 MG tablet    Sig: Take 1 tablet by mouth daily.    Dispense:  90 tablet    Refill:  3    Order Specific Question:   Supervising Provider    Answer:   Hildred Laser [[id number]]    Follow-up: Return in 1 year (on  06/03/2024).  Dominica Severin, CNM 06/04/2023 11:00 AM

## 2023-06-05 LAB — CERVICOVAGINAL ANCILLARY ONLY
Chlamydia: NEGATIVE
Comment: NEGATIVE
Comment: NEGATIVE
Comment: NORMAL
Neisseria Gonorrhea: NEGATIVE
Trichomonas: NEGATIVE

## 2023-06-09 ENCOUNTER — Encounter: Payer: Self-pay | Admitting: Certified Nurse Midwife

## 2023-06-09 LAB — CYTOLOGY - PAP: Adequacy: ABSENT

## 2023-07-08 ENCOUNTER — Encounter: Payer: 59 | Admitting: Obstetrics and Gynecology

## 2023-07-08 ENCOUNTER — Telehealth: Payer: Self-pay

## 2023-07-08 DIAGNOSIS — R87612 Low grade squamous intraepithelial lesion on cytologic smear of cervix (LGSIL): Secondary | ICD-10-CM

## 2023-07-08 NOTE — Telephone Encounter (Signed)
Pt called after hour nurse 05/07/23 5:42pm stating she was supposed to get a callback on today.  Her period is still on and she would like to know what to do regarding her appt.  After hour nurse supplied office hours.  [phone]

## 2023-07-08 NOTE — Telephone Encounter (Signed)
The patient is returning call. The patient states she is currently are her cycle still and wants to schedule for next available. The patient si added to cancellation list for this procedure. The patient states to scheduled that opening and leave voicemail with new info and she will see it on my chart.

## 2023-07-08 NOTE — Telephone Encounter (Signed)
Called patient, lvm

## 2023-07-23 ENCOUNTER — Other Ambulatory Visit
Admission: RE | Admit: 2023-07-23 | Discharge: 2023-07-23 | Disposition: A | Discharge status: Home / Self Care | Payer: 59 | Source: Ambulatory Visit | Attending: Emergency Medicine | Admitting: Emergency Medicine

## 2023-07-23 DIAGNOSIS — R5383 Other fatigue: Secondary | ICD-10-CM | POA: Insufficient documentation

## 2023-07-23 LAB — IRON AND TIBC
Iron: 142 ug/dL (ref 28–170)
Saturation Ratios: 32 % — ABNORMAL HIGH (ref 10.4–31.8)
TIBC: 444 ug/dL (ref 250–450)
UIBC: 302 ug/dL

## 2023-07-23 LAB — CBC WITH DIFFERENTIAL/PLATELET
Abs Immature Granulocytes: 0.04 10*3/uL (ref 0.00–0.07)
Basophils Absolute: 0.1 10*3/uL (ref 0.0–0.1)
Basophils Relative: 0 %
Eosinophils Absolute: 0 10*3/uL (ref 0.0–0.5)
Eosinophils Relative: 0 %
HCT: 43.7 % (ref 36.0–46.0)
Hemoglobin: 15 g/dL (ref 12.0–15.0)
Immature Granulocytes: 0 %
Lymphocytes Relative: 30 %
Lymphs Abs: 4.3 10*3/uL — ABNORMAL HIGH (ref 0.7–4.0)
MCH: 30.5 pg (ref 26.0–34.0)
MCHC: 34.3 g/dL (ref 30.0–36.0)
MCV: 89 fL (ref 80.0–100.0)
Monocytes Absolute: 0.9 10*3/uL (ref 0.1–1.0)
Monocytes Relative: 6 %
Neutro Abs: 9.2 10*3/uL — ABNORMAL HIGH (ref 1.7–7.7)
Neutrophils Relative %: 64 %
Platelets: 380 10*3/uL (ref 150–400)
RBC: 4.91 MIL/uL (ref 3.87–5.11)
RDW: 11.8 % (ref 11.5–15.5)
WBC: 14.5 10*3/uL — ABNORMAL HIGH (ref 4.0–10.5)
nRBC: 0 % (ref 0.0–0.2)

## 2023-07-23 LAB — VITAMIN D 25 HYDROXY (VIT D DEFICIENCY, FRACTURES): Vit D, 25-Hydroxy: 28.33 ng/mL — ABNORMAL LOW (ref 30–100)

## 2023-07-23 LAB — COMPREHENSIVE METABOLIC PANEL
ALT: 20 U/L (ref 0–44)
AST: 18 U/L (ref 15–41)
Albumin: 4 g/dL (ref 3.5–5.0)
Alkaline Phosphatase: 42 U/L (ref 38–126)
Anion gap: 7 (ref 5–15)
BUN: 12 mg/dL (ref 6–20)
CO2: 22 mmol/L (ref 22–32)
Calcium: 8.7 mg/dL — ABNORMAL LOW (ref 8.9–10.3)
Chloride: 105 mmol/L (ref 98–111)
Creatinine, Ser: 0.61 mg/dL (ref 0.44–1.00)
GFR, Estimated: >60 mL/min (ref >60)
Glucose, Bld: 99 mg/dL (ref 70–99)
Potassium: 3.7 mmol/L (ref 3.5–5.1)
Sodium: 134 mmol/L — ABNORMAL LOW (ref 135–145)
Total Bilirubin: 0.6 mg/dL (ref 0.3–1.2)
Total Protein: 7.8 g/dL (ref 6.5–8.1)

## 2023-07-23 LAB — VITAMIN B12: Vitamin B-12: 422 pg/mL (ref 180–914)

## 2023-07-23 LAB — FERRITIN: Ferritin: 82 ng/mL (ref 11–307)

## 2023-07-23 LAB — TSH: TSH: 2.215 u[IU]/mL (ref 0.350–4.500)

## 2023-08-11 ENCOUNTER — Encounter: Payer: Self-pay | Admitting: Obstetrics & Gynecology

## 2023-08-11 ENCOUNTER — Ambulatory Visit (INDEPENDENT_AMBULATORY_CARE_PROVIDER_SITE_OTHER): Payer: 59 | Admitting: Obstetrics & Gynecology

## 2023-08-11 ENCOUNTER — Other Ambulatory Visit (HOSPITAL_COMMUNITY)
Admission: RE | Admit: 2023-08-11 | Discharge: 2023-08-11 | Disposition: A | Discharge status: Home / Self Care | Payer: 59 | Source: Ambulatory Visit | Attending: Obstetrics and Gynecology | Admitting: Obstetrics and Gynecology

## 2023-08-11 VITALS — BP 134/69 | HR 80 | Ht 67.0 in | Wt 275.0 lb

## 2023-08-11 DIAGNOSIS — R87612 Low grade squamous intraepithelial lesion on cytologic smear of cervix (LGSIL): Secondary | ICD-10-CM

## 2023-08-11 DIAGNOSIS — N870 Mild cervical dysplasia: Secondary | ICD-10-CM

## 2023-08-11 DIAGNOSIS — B9689 Other specified bacterial agents as the cause of diseases classified elsewhere: Secondary | ICD-10-CM

## 2023-08-11 MED ORDER — METRONIDAZOLE 500 MG PO TABS: 500.0000 mg | ORAL_TABLET | Freq: Two times a day (BID) | ORAL | 0 refills | Status: AC

## 2023-08-11 NOTE — Progress Notes (Signed)
    GYNECOLOGY PROGRESS NOTE  Subjective:    Patient ID: Grace Buchanan, female    DOB: [birth year], 27 y.o.   MRN: [id number]  HPI  Patient is a 27 y.o. G0P0000 who presents for a colposcopy. She had a LGSIL pap 05/2023.   The following portions of the patient's history were reviewed and updated as appropriate: allergies, current medications, past family history, past medical history, past social history, past surgical history, and problem list.  Review of Systems Pertinent items are noted in HPI.  She already had Gardasil.  Objective:   Blood pressure 134/69, pulse 80, height 5\' 7"  (1.702 m), weight 275 lb (124.7 kg), last menstrual period 08/04/2023. Body mass index is 43.07 kg/m. Well nourished, well hydrated White female, no apparent distress She is ambulating and conversing normally. Consent signed, time out done Cervix prepped with acetic acid. Transformation zone seen in its entirety. Colpo adequate. Ectocervix completely normal appearance ECC obtained. She tolerated the procedure well.  Vaginal discharge c/w bv   Assessment:   1. LGSIL on Pap smear of cervix      Plan:   1. LGSIL on Pap smear of cervix - await pathology  2. BV- flagyl prescribed

## 2023-08-13 LAB — SURGICAL PATHOLOGY

## 2023-08-18 ENCOUNTER — Encounter: Payer: Self-pay | Admitting: Obstetrics & Gynecology

## 2023-09-01 ENCOUNTER — Telehealth: Payer: Self-pay | Admitting: Nurse Practitioner

## 2023-09-01 NOTE — Telephone Encounter (Signed)
Called pt to schedule appt haven't seen pt since 12/14/21

## 2023-09-08 ENCOUNTER — Encounter: Payer: Self-pay | Admitting: Obstetrics & Gynecology

## 2023-10-01 ENCOUNTER — Ambulatory Visit (INDEPENDENT_AMBULATORY_CARE_PROVIDER_SITE_OTHER): Payer: 59

## 2023-10-01 VITALS — BP 131/83 | HR 82 | Ht 67.0 in | Wt 270.0 lb

## 2023-10-01 DIAGNOSIS — R300 Dysuria: Secondary | ICD-10-CM | POA: Diagnosis not present

## 2023-10-01 LAB — POCT URINALYSIS DIPSTICK: Spec Grav, UA: 1.015 (ref 1.010–1.025)

## 2023-10-01 NOTE — Progress Notes (Signed)
    NURSE VISIT NOTE  Subjective:    Patient ID: Grace Buchanan, female    DOB: 1996-08-31, 27 y.o.   MRN: [id number]       HPI  Patient is a 27 y.o. G0P0000 female who presents for dysuria for 3 days.  Patient denies hematuria, urinary frequency, urinary urgency, flank pain, abdominal pain, pelvic pain, cloudy malordorous urine, genital rash, genital irritation, and vaginal discharge.  Patient does not have a history of recurrent UTI.  Patient does not have a history of pyelonephritis.    Objective:    BP 131/83   Pulse 82   Ht 5\' 7"  (1.702 m)   Wt 270 lb (122.5 kg)   LMP 07/30/2023 (Approximate)   BMI 42.29 kg/m    Lab Review  No results found for any visits on 10/01/23.  Assessment:   1. Dysuria      Plan:   Urine Culture Sent. Maintain adequate hydration.  May use AZO OTC prn.  Follow up if symptoms worsen or fail to improve as anticipated, and as needed.  Patient has taken over the counter Azo unable to determine results of POCT U/A done in office, will send for culture and await results.      Fonda Kinder, CMA

## 2023-10-01 NOTE — Patient Instructions (Signed)
Dysuria Dysuria is pain or discomfort during urination. The pain or discomfort may be felt in the part of the body that drains urine from the bladder (urethra) or in the surrounding tissue of the genitals. The pain may also be felt in the groin area, lower abdomen, or lower back. You may have to urinate frequently or have the sudden feeling that you have to urinate (urgency). Dysuria can affect anyone, but it is more common in females. Dysuria can be caused by many different things, including: Urinary tract infection. Kidney stones or bladder stones. Certain STIs (sexually transmitted infections), such as chlamydia. Dehydration. Inflammation of the tissues of the vagina. Use of certain medicines. Use of certain soaps or scented products that cause irritation. Follow these instructions at home: Medicines Take over-the-counter and prescription medicines only as told by your health care provider. If you were prescribed an antibiotic medicine, take it as told by your health care provider. Do not stop taking the antibiotic even if you start to feel better. Eating and drinking  Drink enough fluid to keep your urine pale yellow. Avoid caffeinated beverages, tea, and alcohol. These beverages can irritate the bladder and make dysuria worse. In males, alcohol may irritate the prostate. General instructions Watch your condition for any changes. Urinate often. Avoid holding urine for long periods of time. If you are female, you should wipe from front to back after urinating or having a bowel movement. Use each piece of toilet paper only once. Empty your bladder after sex. Keep all follow-up visits. This is important. If you had any tests done to find the cause of dysuria, it is up to you to get your test results. Ask your health care provider, or the department that is doing the test, when your results will be ready. Contact a health care provider if: You have a fever. You develop pain in your back or  sides. You have nausea or vomiting. You have blood in your urine. You are not urinating as often as you usually do. Get help right away if: Your pain is severe and not relieved with medicines. You cannot eat or drink without vomiting. You are confused. You have a rapid heartbeat while resting. You have shaking or chills. You feel extremely weak. Summary Dysuria is pain or discomfort while urinating. Many different conditions can lead to dysuria. If you have dysuria, you may have to urinate frequently or have the sudden feeling that you have to urinate (urgency). Watch your condition for any changes. Keep all follow-up visits. Make sure that you urinate often and drink enough fluid to keep your urine pale yellow. This information is not intended to replace advice given to you by your health care provider. Make sure you discuss any questions you have with your health care provider. Document Revised: 07/21/2020 Document Reviewed: 07/21/2020 Elsevier Patient Education  2024 Elsevier Inc.  

## 2023-10-02 ENCOUNTER — Telehealth: Payer: Self-pay

## 2023-10-02 ENCOUNTER — Other Ambulatory Visit: Payer: Self-pay

## 2023-10-02 DIAGNOSIS — R300 Dysuria: Secondary | ICD-10-CM

## 2023-10-02 DIAGNOSIS — R319 Hematuria, unspecified: Secondary | ICD-10-CM

## 2023-10-02 MED ORDER — SULFAMETHOXAZOLE-TRIMETHOPRIM 800-160 MG PO TABS: 1.0000 | ORAL_TABLET | Freq: Two times a day (BID) | ORAL | 1 refills | Status: AC

## 2023-10-02 NOTE — Telephone Encounter (Signed)
Pt calling about urine dip; nurse yesterday adv pt to call when she saw it.  [phone]  [facility]

## 2023-10-02 NOTE — Telephone Encounter (Signed)
Pt quickly returned call; is having burning with urination.  Adv will send in Bactrim for her; at her request it was sent to [facility] on [address] in [place].

## 2023-10-04 LAB — URINE CULTURE

## 2024-05-07 ENCOUNTER — Other Ambulatory Visit: Payer: Self-pay

## 2024-05-07 ENCOUNTER — Other Ambulatory Visit: Payer: Self-pay | Admitting: Certified Nurse Midwife

## 2024-05-07 DIAGNOSIS — E282 Polycystic ovarian syndrome: Secondary | ICD-10-CM

## 2024-05-07 DIAGNOSIS — Z30011 Encounter for initial prescription of contraceptive pills: Secondary | ICD-10-CM

## 2024-05-07 MED ORDER — DROSPIRENONE-ETHINYL ESTRADIOL 3-0.02 MG PO TABS: 1.0000 | ORAL_TABLET | Freq: Every day | ORAL | 0 refills | Status: DC

## 2024-05-13 ENCOUNTER — Other Ambulatory Visit: Payer: Self-pay

## 2024-05-13 ENCOUNTER — Telehealth: Payer: Self-pay

## 2024-05-13 DIAGNOSIS — Z30011 Encounter for initial prescription of contraceptive pills: Secondary | ICD-10-CM

## 2024-05-13 DIAGNOSIS — E282 Polycystic ovarian syndrome: Secondary | ICD-10-CM

## 2024-05-13 NOTE — Telephone Encounter (Signed)
 Pt asking for refill of BC, I reviewed chart where Katy RN sent her one refill to get her to schdule apt, annual schduled she was unaware Rn sent refill stated she takes NIkki  RN sent YAz i let her know thats the only BC we have in file.Pt understood.

## 2024-06-04 ENCOUNTER — Telehealth: Payer: Self-pay | Admitting: Nurse Practitioner

## 2024-06-04 NOTE — Telephone Encounter (Signed)
Lvm  & sent message to schedule ED follow up-Toni

## 2024-06-10 ENCOUNTER — Ambulatory Visit: Admitting: Certified Nurse Midwife

## 2024-08-01 ENCOUNTER — Other Ambulatory Visit: Payer: Self-pay | Admitting: Certified Nurse Midwife

## 2024-08-01 DIAGNOSIS — Z30011 Encounter for initial prescription of contraceptive pills: Secondary | ICD-10-CM

## 2024-08-01 DIAGNOSIS — E282 Polycystic ovarian syndrome: Secondary | ICD-10-CM

## 2024-09-09 NOTE — Progress Notes (Signed)
 ANNUAL EXAM Patient name: Grace Buchanan MRN [id number]  Date of birth: October 22, 1996 Chief Complaint:   Gynecologic Exam  History of Present Illness:   Grace Buchanan is a 28 y.o. G0P0000 Caucasian female being seen today for a routine annual exam.  Current complaints: Desires STI testing. She would like to return for blood testing, but will do vaginal swabs today.  Currently using COCs (generic Yaz) for contraception. She takes this daily and does not miss doses. However, there was a delay in refilling her last pill pack and so it was started about a week late. She moved to [place] 5 months ago for a new start. She wants to continue seeing us  for care.   Patient's last menstrual period was 07/26/2024 (approximate).   The pregnancy intention screening data noted above was reviewed. Potential methods of contraception were discussed. The patient elected to continue with COCs.     Component Value Date/Time   DIAGPAP - Low grade squamous intraepithelial lesion (LSIL) (A) 06/04/2023 0907   ADEQPAP  06/04/2023 0907    Satisfactory for evaluation; transformation zone component ABSENT.       Last pap 05/2023. Results were: LSIL w/ HRHPV not done. H/O abnormal pap: yes. 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 Last mammogram:N/A Last colonoscopy: N/A     09/10/2024    1:34 PM 05/02/2021   11:03 AM 01/28/2019    9:37 AM 01/19/2018    3:17 PM  Depression screen PHQ 2/9  Decreased Interest 0 0 0 0  Down, Depressed, Hopeless 0 0 0 0  PHQ - 2 Score 0 0 0 0         No data to display            Past Medical History:  Diagnosis Date   Anxiety    Scoliosis     Family History  Problem Relation Age of Onset   Hyperthyroidism Mother    Hypertension Mother    Hypertension Father    Review of Systems:   Pertinent items are noted in HPI Denies any headaches, blurred vision, fatigue, shortness of breath, chest pain, abdominal pain, abnormal vaginal discharge/itching/odor/irritation, problems  with periods, bowel movements, urination, or intercourse unless otherwise stated above. Pertinent History Reviewed:  Reviewed past medical,surgical, social and family history.  Reviewed problem list, medications and allergies. Physical Assessment:   Vitals:   09/10/24 1400  BP: 134/83  Pulse: 85  Weight: 281 lb 8 oz (127.7 kg)  Body mass index is 44.09 kg/m.       Physical Exam Vitals reviewed.  Constitutional:      Appearance: She is obese. She is not toxic-appearing.  HENT:     Head: Normocephalic.     Nose: Nose normal.     Mouth/Throat:     Mouth: Mucous membranes are moist.  Eyes:     Extraocular Movements: Extraocular movements intact.  Cardiovascular:     Rate and Rhythm: Normal rate and regular rhythm.  Pulmonary:     Effort: Pulmonary effort is normal.     Breath sounds: Normal breath sounds.  Abdominal:     General: Bowel sounds are normal. There is no distension.     Palpations: Abdomen is soft. There is no mass.     Tenderness: There is no abdominal tenderness.     Hernia: No hernia is present.  Musculoskeletal:        General: Normal range of motion.     Cervical back: Normal range of motion and neck supple.  No tenderness.  Skin:    General: Skin is warm and dry.     Capillary Refill: Capillary refill takes less than 2 seconds.  Neurological:     General: No focal deficit present.     Mental Status: She is alert and oriented to person, place, and time.  Psychiatric:        Mood and Affect: Mood normal.        Behavior: Behavior normal.        Thought Content: Thought content normal.        Judgment: Judgment normal.      No results found for this or any previous visit (from the past 24 hours).  Assessment & Plan:  Cervical cancer screening Hx LSIL last year. Colpo 07/2023 showed CIN 1. Pap w/ reflex cotesting today.  Routine screening for STI (sexually transmitted infection) Recently found out that (former) partner had other sexual partners.   -STI testing today. Will return for blood work per her request. Cervical/ vaginal specimens sent today.   PCOS (polycystic ovarian syndrome) Oligomenorrhea Doing well on Yaz. Will continue.    Mammogram: @ 26yo, or sooner if problems Colonoscopy: @ 26yo, or sooner if problems  Orders Placed This Encounter  Procedures   HEP, RPR, HIV Panel    Meds:  Meds ordered this encounter  Medications   drospirenone -ethinyl estradiol  (YAZ) 3-0.02 MG tablet    Sig: Take 1 tablet by mouth daily.    Dispense:  90 tablet    Refill:  3    Follow-up: No follow-ups on file.  Charma DOMINO, CNM 09/11/2024 9:05 AM

## 2024-09-10 ENCOUNTER — Ambulatory Visit (INDEPENDENT_AMBULATORY_CARE_PROVIDER_SITE_OTHER): Admitting: Certified Nurse Midwife

## 2024-09-10 ENCOUNTER — Encounter: Payer: Self-pay | Admitting: Certified Nurse Midwife

## 2024-09-10 ENCOUNTER — Other Ambulatory Visit (HOSPITAL_COMMUNITY)
Admission: RE | Admit: 2024-09-10 | Discharge: 2024-09-10 | Disposition: A | Source: Ambulatory Visit | Attending: Certified Nurse Midwife | Admitting: Certified Nurse Midwife

## 2024-09-10 VITALS — BP 134/83 | HR 85 | Wt 281.5 lb

## 2024-09-10 DIAGNOSIS — Z30011 Encounter for initial prescription of contraceptive pills: Secondary | ICD-10-CM

## 2024-09-10 DIAGNOSIS — R87612 Low grade squamous intraepithelial lesion on cytologic smear of cervix (LGSIL): Secondary | ICD-10-CM

## 2024-09-10 DIAGNOSIS — Z113 Encounter for screening for infections with a predominantly sexual mode of transmission: Secondary | ICD-10-CM | POA: Insufficient documentation

## 2024-09-10 DIAGNOSIS — Z124 Encounter for screening for malignant neoplasm of cervix: Secondary | ICD-10-CM

## 2024-09-10 DIAGNOSIS — Z01419 Encounter for gynecological examination (general) (routine) without abnormal findings: Secondary | ICD-10-CM

## 2024-09-10 DIAGNOSIS — E282 Polycystic ovarian syndrome: Secondary | ICD-10-CM

## 2024-09-10 DIAGNOSIS — Z3041 Encounter for surveillance of contraceptive pills: Secondary | ICD-10-CM

## 2024-09-10 MED ORDER — DROSPIRENONE-ETHINYL ESTRADIOL 3-0.02 MG PO TABS: 1.0000 | ORAL_TABLET | Freq: Every day | ORAL | 3 refills | Status: AC

## 2024-09-10 NOTE — Assessment & Plan Note (Addendum)
 Hx LSIL last year. Colpo 07/2023 showed CIN 1. Pap w/ reflex cotesting today.

## 2024-09-10 NOTE — Assessment & Plan Note (Signed)
 Recently found out that (former) partner had other sexual partners.  -STI testing today. Will return for blood work per her request. Cervical/ vaginal specimens sent today.

## 2024-09-10 NOTE — Assessment & Plan Note (Signed)
 Oligomenorrhea Doing well on Yaz. Will continue.

## 2024-09-11 ENCOUNTER — Encounter: Payer: Self-pay | Admitting: Certified Nurse Midwife

## 2024-09-17 LAB — CYTOLOGY - PAP
Comment: NEGATIVE
Comment: NEGATIVE
Comment: NORMAL
Comment: NORMAL
Neisseria Gonorrhea: NEGATIVE
Neisseria Gonorrhea: POSITIVE — AB
Trichomonas: NEGATIVE

## 2024-09-18 ENCOUNTER — Ambulatory Visit: Payer: Self-pay | Admitting: Registered Nurse

## 2024-10-20 NOTE — Progress Notes (Signed)
 Referring Provider:  Lauraine Lakes, CNM  HPI:  Grace Buchanan is a 28 y.o.  G0P0000  who presents today for evaluation and management of abnormal cervical cytology.    Prior pap smears:  09/10/2024:  LSIL HPV + (undiff) 06/04/2023:  LSIL (no HPV testing done)  Prior cervical / vaginal findings: Colposcopy 08/11/2023:  CIN 1 on ECC  Prior cervical treatment(s): NA Date: Results:   Symptoms/History:  -Abnormal vaginal discharge: NO -Postmenopausal: no -Intermenstrual bleeding: NO -Postcoital bleeding: NO -Bleeding problems (non-gyn): NO -Contraception: OCPs -Number of current sexual partners: 1 -Number of partners in lifetime: 8 -History of a high risk partner: NO -History of STDs: NO -Smoking: NO -Gardasil Vaccine: yes, completed in 2020/2021      ROS:  Pertinent items are noted in HPI.  OB History  Gravida Para Term Preterm AB Living  0 0 0 0 0 0  SAB IAB Ectopic Multiple Live Births  0 0 0 0 0    Past Medical History:  Diagnosis Date   Anxiety    Scoliosis     Past Surgical History:  Procedure Laterality Date   BACK SURGERY     TONSILECTOMY/ADENOIDECTOMY WITH MYRINGOTOMY Bilateral 12/31/2017    SOCIAL HISTORY:  Social History   Substance and Sexual Activity  Alcohol Use Yes   Comment: ocassionally    Social History   Substance and Sexual Activity  Drug Use Yes   Types: Marijuana   Comment: occ.     Family History  Problem Relation Age of Onset   Hyperthyroidism Mother    Hypertension Mother    Hypertension Father     ALLERGIES:  Gluten meal, Shellfish allergy, Dust mite extract, Fish allergy, and Propofol  She has a current medication list which includes the following prescription(s): albuterol, drospirenone -ethinyl estradiol , hydroxyzine , amoxicillin , amoxicillin -clavulanate, benzonatate , metronidazole , ondansetron , and sulfamethoxazole -trimethoprim .  Physical Exam: -Vitals:  BP 120/66   Pulse 62   Ht 5' 7 (1.702 m)   Wt 282 lb  (127.9 kg)   LMP 10/22/2024   BMI 44.17 kg/m   PROCEDURE: Colposcopy performed with 4% acetic acid and Lugol's after informed consent obtained. Urine pregnancy test negative.  Physical Exam Vitals and nursing note reviewed. Exam conducted with a chaperone present.  Constitutional:      Appearance: Normal appearance. She is obese.  Eyes:     Extraocular Movements: Extraocular movements intact.  Pulmonary:     Effort: Pulmonary effort is normal.  Genitourinary:    General: Normal vulva.     Vagina: Normal.     Cervix: Normal. No cervical motion tenderness, discharge, friability, lesion, erythema or cervical bleeding.     Uterus: Normal.      Adnexa: Right adnexa normal and left adnexa normal.  Neurological:     General: No focal deficit present.     Mental Status: She is alert.  Psychiatric:        Mood and Affect: Mood normal.                               -Aceto-white Lesions Location(s): none              -Biopsy performed: none              -ECC indicated and performed: Yes.     -Satisfactory colposcopy: Yes.      -Evidence of Invasive cervical CA :  NO  ASSESSMENT:  Grace Buchanan is a 28  y.o. G0P0000 with LSIL and HPV-HR positive, undifferentiated, on recent pap (09/10/24), here for colposcopy today, performed as above without complications.  -ECC and 0 cervical bx sent to pathology -Aftercare instructions for home reviewed, si/sx of when to call/return discussed. -Gardasil series completed in 2021; does not smoke. -Discussed possible outcomes based on pathology; will call with results   Estil Mangle, DO [facility]

## 2024-10-25 NOTE — Patient Instructions (Signed)
 Colposcopy, Care After  The following information offers guidance on how to care for yourself after your procedure. Your doctor may also give you more specific instructions. If you have problems or questions, contact your doctor. What can I expect after the procedure? If you did not have a sample of your tissue taken out (did not have a biopsy), you may only have some spotting of blood for a few days. You can go back to your normal activities. If you had a sample of your tissue taken out, it is common to have: Soreness and mild pain. These may last for a few days. Mild bleeding or fluid (discharge) coming from your vagina. The fluid will look dark and grainy. You may have this for a few days. The fluid may be caused by a liquid that was used during your procedure. You may need to wear a sanitary pad. Spotting of blood for at least 48 hours after the procedure. Follow these instructions at home: Medicines Take over-the-counter and prescription medicines only as told by your doctor. Ask your doctor what over-the-counter pain medicines and prescription medicines you can start taking again. This is very important if you take blood thinners. Activity For at least 3 days, or for as long as told by your doctor, avoid: Douching. Using tampons. Having sex. Return to your normal activities as told by your doctor. Ask your doctor what activities are safe for you. General instructions Ask your doctor if you may take baths, swim, or use a hot tub. You may take showers. If you use birth control (contraception), keep using it. Keep all follow-up visits. Contact a doctor if: You have a fever or chills. You faint or feel light-headed. Get help right away if: You bleed a lot from your vagina. A lot of bleeding means that the bleeding soaks through a pad in less than 1 hour. You have clumps of blood (blood clots) coming from your vagina. You have signs that could mean you have an infection. This may be  fluid coming from your vagina that is: Different than normal. Yellow. Bad-smelling. You have very bad pain or cramps in your lower belly that do not get better with medicine. Summary If you did not have a sample of your tissue taken out, you may only have some spotting of blood for a few days. You can go back to your normal activities. If you had a sample of your tissue taken out, it is common to have mild pain for a few days and spotting for 48 hours. Avoid douching, using tampons, and having sex for at least 3 days after the procedure or for as long as told. Get help right away if you have a lot of bleeding, very bad pain, or signs of infection. This information is not intended to replace advice given to you by your health care provider. Make sure you discuss any questions you have with your health care provider. Document Revised: 05/06/2021 Document Reviewed: 05/06/2021 Elsevier Patient Education  2024 [employer].

## 2024-10-26 ENCOUNTER — Other Ambulatory Visit (HOSPITAL_COMMUNITY)
Admission: RE | Admit: 2024-10-26 | Discharge: 2024-10-26 | Disposition: A | Discharge status: Home / Self Care | Source: Ambulatory Visit | Attending: Obstetrics | Admitting: Obstetrics

## 2024-10-26 ENCOUNTER — Ambulatory Visit: Admitting: Obstetrics

## 2024-10-26 ENCOUNTER — Encounter: Payer: Self-pay | Admitting: Obstetrics

## 2024-10-26 VITALS — BP 120/66 | HR 62 | Ht 67.0 in | Wt 282.0 lb

## 2024-10-26 DIAGNOSIS — R87612 Low grade squamous intraepithelial lesion on cytologic smear of cervix (LGSIL): Secondary | ICD-10-CM | POA: Diagnosis not present

## 2024-10-26 DIAGNOSIS — Z3202 Encounter for pregnancy test, result negative: Secondary | ICD-10-CM | POA: Diagnosis not present

## 2024-10-26 DIAGNOSIS — N888 Other specified noninflammatory disorders of cervix uteri: Secondary | ICD-10-CM | POA: Diagnosis not present

## 2024-10-26 DIAGNOSIS — R87810 Cervical high risk human papillomavirus (HPV) DNA test positive: Secondary | ICD-10-CM | POA: Diagnosis not present

## 2024-10-26 DIAGNOSIS — N889 Noninflammatory disorder of cervix uteri, unspecified: Secondary | ICD-10-CM | POA: Diagnosis not present

## 2024-10-26 LAB — POCT URINE PREGNANCY: Preg Test, Ur: NEGATIVE

## 2024-10-27 LAB — SURGICAL PATHOLOGY

## 2024-10-29 ENCOUNTER — Ambulatory Visit: Payer: Self-pay | Admitting: Obstetrics

## 2024-12-01 DIAGNOSIS — J101 Influenza due to other identified influenza virus with other respiratory manifestations: Secondary | ICD-10-CM | POA: Diagnosis not present
